# Patient Record
Sex: Female | Born: 1985 | Race: White | Hispanic: No | Marital: Single | Smoking: Current every day smoker
Health system: Southern US, Community
[De-identification: ages and names within clinical notes are randomized; demographics above are authoritative.]

## PROBLEM LIST (undated history)

## (undated) DIAGNOSIS — F319 Bipolar disorder, unspecified: Secondary | ICD-10-CM

## (undated) DIAGNOSIS — F909 Attention-deficit hyperactivity disorder, unspecified type: Secondary | ICD-10-CM

## (undated) HISTORY — PX: CHOLECYSTECTOMY: SHX55

## (undated) HISTORY — PX: ABDOMINAL HYSTERECTOMY: SHX81

---

## 2019-03-10 ENCOUNTER — Other Ambulatory Visit: Payer: Self-pay

## 2019-03-10 ENCOUNTER — Encounter (HOSPITAL_COMMUNITY): Payer: Self-pay

## 2019-03-10 ENCOUNTER — Emergency Department (HOSPITAL_COMMUNITY)
Admission: EM | Admit: 2019-03-10 | Discharge: 2019-03-11 | Disposition: A | Discharge status: Home / Self Care | Payer: Self-pay | Attending: Emergency Medicine | Admitting: Emergency Medicine

## 2019-03-10 DIAGNOSIS — R109 Unspecified abdominal pain: Secondary | ICD-10-CM | POA: Insufficient documentation

## 2019-03-10 DIAGNOSIS — Z5321 Procedure and treatment not carried out due to patient leaving prior to being seen by health care provider: Secondary | ICD-10-CM | POA: Insufficient documentation

## 2019-03-10 LAB — URINALYSIS, ROUTINE W REFLEX MICROSCOPIC
Bacteria, UA: NONE SEEN
Bilirubin Urine: NEGATIVE
Glucose, UA: NEGATIVE mg/dL
Hgb urine dipstick: NEGATIVE
Ketones, ur: NEGATIVE mg/dL
Nitrite: NEGATIVE
Protein, ur: NEGATIVE mg/dL
Specific Gravity, Urine: 1.012 (ref 1.005–1.030)
pH: 5 (ref 5.0–8.0)

## 2019-03-10 LAB — CBC
HCT: 37 % (ref 36.0–46.0)
Hemoglobin: 12.2 g/dL (ref 12.0–15.0)
MCH: 30.5 pg (ref 26.0–34.0)
MCHC: 33 g/dL (ref 30.0–36.0)
MCV: 92.5 fL (ref 80.0–100.0)
Platelets: 282 10*3/uL (ref 150–400)
RBC: 4 MIL/uL (ref 3.87–5.11)
RDW: 11.8 % (ref 11.5–15.5)
WBC: 7.9 10*3/uL (ref 4.0–10.5)
nRBC: 0 % (ref 0.0–0.2)

## 2019-03-10 LAB — COMPREHENSIVE METABOLIC PANEL
ALT: 73 U/L — ABNORMAL HIGH (ref 0–44)
AST: 48 U/L — ABNORMAL HIGH (ref 15–41)
Albumin: 3.4 g/dL — ABNORMAL LOW (ref 3.5–5.0)
Alkaline Phosphatase: 68 U/L (ref 38–126)
Anion gap: 8 (ref 5–15)
BUN: 8 mg/dL (ref 6–20)
CO2: 27 mmol/L (ref 22–32)
Calcium: 9 mg/dL (ref 8.9–10.3)
Chloride: 106 mmol/L (ref 98–111)
Creatinine, Ser: 0.76 mg/dL (ref 0.44–1.00)
GFR calc Af Amer: >60 mL/min (ref >60)
GFR calc non Af Amer: >60 mL/min (ref >60)
Glucose, Bld: 81 mg/dL (ref 70–99)
Potassium: 3.7 mmol/L (ref 3.5–5.1)
Sodium: 141 mmol/L (ref 135–145)
Total Bilirubin: 0.4 mg/dL (ref 0.3–1.2)
Total Protein: 6.1 g/dL — ABNORMAL LOW (ref 6.5–8.1)

## 2019-03-10 LAB — I-STAT BETA HCG BLOOD, ED (MC, WL, AP ONLY): I-stat hCG, quantitative: <5 m[IU]/mL (ref <5)

## 2019-03-10 LAB — LIPASE, BLOOD: Lipase: 34 U/L (ref 11–51)

## 2019-03-10 NOTE — ED Triage Notes (Signed)
Pt arrives POV for eval of generalized abd pain x 1 week, w/ vomiting. Pt states she is unable to keep anything down. Pt is noted to be eating [employer] during triage. Denies dysuria, vag bleeding/DC. Denies known sick contacts

## 2019-03-19 ENCOUNTER — Encounter (HOSPITAL_COMMUNITY): Payer: Self-pay | Admitting: Student

## 2019-03-19 ENCOUNTER — Other Ambulatory Visit: Payer: Self-pay

## 2019-03-19 ENCOUNTER — Emergency Department (HOSPITAL_COMMUNITY): Payer: Self-pay

## 2019-03-19 ENCOUNTER — Emergency Department (HOSPITAL_COMMUNITY)
Admission: EM | Admit: 2019-03-19 | Discharge: 2019-03-19 | Disposition: A | Discharge status: Home / Self Care | Payer: Self-pay | Attending: Emergency Medicine | Admitting: Emergency Medicine

## 2019-03-19 DIAGNOSIS — R1030 Lower abdominal pain, unspecified: Secondary | ICD-10-CM | POA: Insufficient documentation

## 2019-03-19 DIAGNOSIS — N3000 Acute cystitis without hematuria: Secondary | ICD-10-CM | POA: Insufficient documentation

## 2019-03-19 DIAGNOSIS — R1110 Vomiting, unspecified: Secondary | ICD-10-CM | POA: Insufficient documentation

## 2019-03-19 LAB — CBC WITH DIFFERENTIAL/PLATELET
Abs Immature Granulocytes: 0.04 10*3/uL (ref 0.00–0.07)
Basophils Absolute: 0 10*3/uL (ref 0.0–0.1)
Basophils Relative: 0 %
Eosinophils Absolute: 0 10*3/uL (ref 0.0–0.5)
Eosinophils Relative: 0 %
HCT: 40.6 % (ref 36.0–46.0)
Hemoglobin: 13.3 g/dL (ref 12.0–15.0)
Immature Granulocytes: 0 %
Lymphocytes Relative: 12 %
Lymphs Abs: 1.7 10*3/uL (ref 0.7–4.0)
MCH: 29.7 pg (ref 26.0–34.0)
MCHC: 32.8 g/dL (ref 30.0–36.0)
MCV: 90.6 fL (ref 80.0–100.0)
Monocytes Absolute: 1 10*3/uL (ref 0.1–1.0)
Monocytes Relative: 7 %
Neutro Abs: 11.5 10*3/uL — ABNORMAL HIGH (ref 1.7–7.7)
Neutrophils Relative %: 81 %
Platelets: 261 10*3/uL (ref 150–400)
RBC: 4.48 MIL/uL (ref 3.87–5.11)
RDW: 11.9 % (ref 11.5–15.5)
WBC: 14.3 10*3/uL — ABNORMAL HIGH (ref 4.0–10.5)
nRBC: 0 % (ref 0.0–0.2)

## 2019-03-19 LAB — URINALYSIS, ROUTINE W REFLEX MICROSCOPIC
Glucose, UA: NEGATIVE mg/dL
Ketones, ur: NEGATIVE mg/dL
Leukocytes,Ua: NEGATIVE
Nitrite: POSITIVE — AB
Protein, ur: 30 mg/dL — AB
Specific Gravity, Urine: 1.031 — ABNORMAL HIGH (ref 1.005–1.030)
pH: 5 (ref 5.0–8.0)

## 2019-03-19 LAB — I-STAT BETA HCG BLOOD, ED (MC, WL, AP ONLY): I-stat hCG, quantitative: <5 m[IU]/mL (ref <5)

## 2019-03-19 LAB — COMPREHENSIVE METABOLIC PANEL
ALT: 53 U/L — ABNORMAL HIGH (ref 0–44)
AST: 38 U/L (ref 15–41)
Albumin: 3.9 g/dL (ref 3.5–5.0)
Alkaline Phosphatase: 75 U/L (ref 38–126)
Anion gap: 10 (ref 5–15)
BUN: 11 mg/dL (ref 6–20)
CO2: 24 mmol/L (ref 22–32)
Calcium: 9.2 mg/dL (ref 8.9–10.3)
Chloride: 102 mmol/L (ref 98–111)
Creatinine, Ser: 0.85 mg/dL (ref 0.44–1.00)
GFR calc Af Amer: >60 mL/min (ref >60)
GFR calc non Af Amer: >60 mL/min (ref >60)
Glucose, Bld: 89 mg/dL (ref 70–99)
Potassium: 3.8 mmol/L (ref 3.5–5.1)
Sodium: 136 mmol/L (ref 135–145)
Total Bilirubin: 1 mg/dL (ref 0.3–1.2)
Total Protein: 6.9 g/dL (ref 6.5–8.1)

## 2019-03-19 MED ORDER — IOHEXOL 300 MG/ML  SOLN
100.0000 mL | Freq: Once | INTRAMUSCULAR | Status: AC | PRN
  Administered 2019-03-19: 100 mL via INTRAVENOUS

## 2019-03-19 MED ORDER — KETOROLAC TROMETHAMINE 15 MG/ML IJ SOLN
15.0000 mg | Freq: Once | INTRAMUSCULAR | Status: AC
  Administered 2019-03-19: 15 mg via INTRAVENOUS
  Filled 2019-03-19: qty 1

## 2019-03-19 MED ORDER — DICYCLOMINE HCL 10 MG PO CAPS
10.0000 mg | ORAL_CAPSULE | Freq: Once | ORAL | Status: AC
  Administered 2019-03-19: 16:00:00 10 mg via ORAL
  Filled 2019-03-19: qty 1

## 2019-03-19 MED ORDER — DICYCLOMINE HCL 20 MG PO TABS: 20.0000 mg | ORAL_TABLET | Freq: Two times a day (BID) | ORAL | 0 refills | Status: DC

## 2019-03-19 MED ORDER — CEPHALEXIN 250 MG PO CAPS: 250.0000 mg | ORAL_CAPSULE | Freq: Four times a day (QID) | ORAL | 0 refills | Status: AC

## 2019-03-19 NOTE — Discharge Instructions (Addendum)
Take antibiotics as prescribed.  Take entire course, even if your symptoms improve. Make sure you are staying well-hydrated water.  Your urine should be clear to pale yellow. Use Tylenol and ibuprofen as needed for pain. Use heating pads to help with pain. You may try Bentyl to help with spasm. Return to the emergency room if you develop high fevers, persistent vomiting, severe worsening pain, or any new, worsening, concerning symptoms.

## 2019-03-19 NOTE — ED Triage Notes (Addendum)
Pt POV from working outside d/t lower abd pain that radiates to lower back w/ dizziness around 1000 this AM. Pt states the abd pain has been occurring for x2 wks., Pt recently seen for same thing 03/10/2019.   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

## 2019-03-19 NOTE — ED Provider Notes (Signed)
[facility] Provider Note   CSN: [id number] Arrival date & time: 03/19/19  1140    History   Chief Complaint Chief Complaint  Patient presents with   Abdominal Pain   Dizziness    HPI Gwendolyn Hampton is a 33 y.o. female presenting for evaluation of lower abdominal pain.  Patient states the past 2 weeks, she has been having intermittent lower abdominal pain worse on the left side.  Her pain was severe this morning, wrapping around to her back.  She reports one episode of vomiting.  She felt really hot and then cold when the pain occurred.  Patient states she has had several episodes of emesis, usually once every couple days.  Nothing makes her pain better, took Tylenol without improvement.  Nothing makes her pain worse including p.o., bowel movements, urination.  Patient reports a history of ovarian cyst, states this feels similar but not exactly the same.  She had a partial hysterectomy several years ago for contraception.  She is sexually active, but states she always uses condoms.  No vaginal discharge.  She denies fevers, chills, chest pain, shortness of breath, current nausea, dysuria, hematuria, or abnormal bowel movements.  Patient states today her urine was very dark.  She states she has no medical problems, takes no medications daily.  She smokes cigarettes, denies alcohol use, reports meth use.  Last used 5 days ago.  Denies other drug use.     HPI  No past medical history on file.  There are no active problems to display for this patient.    OB History   No obstetric history on file.      Home Medications    Prior to Admission medications   Medication Sig Start Date End Date Taking? Authorizing Provider  Acetaminophen 500 MG coapsule Take 1,000 mg by mouth every 6 (six) hours as needed.   Yes [provider]  cephALEXin (KEFLEX) 250 MG capsule Take 1 capsule (250 mg total) by mouth 4 (four) times daily for 7 days.  03/19/19 03/26/19  Sakina Briones, PA-C  dicyclomine (BENTYL) 20 MG tablet Take 1 tablet (20 mg total) by mouth 2 (two) times daily. 03/19/19   Starnisha Batrez, PA-C    Family History No family history on file.  Social History Social History   Tobacco Use   Smoking status: Not on file  Substance Use Topics   Alcohol use: Not on file   Drug use: Not on file     Allergies   Bee venom, Ibuprofen, Klonopin [clonazepam], Morphine and related, Sulfa antibiotics, and Tramadol   Review of Systems Review of Systems  Gastrointestinal: Positive for abdominal pain and vomiting (X1).  All other systems reviewed and are negative.    Physical Exam Updated Vital Signs BP 106/89 (BP Location: Left Arm)    Pulse 78    Temp (!) 97.5 F (36.4 C) (Oral)    Resp 16    Ht 5\' 1"  (1.549 m)    Wt 72.6 kg    SpO2 100%    BMI 30.23 kg/m   Physical Exam Vitals signs and nursing note reviewed.  Constitutional:      General: She is not in acute distress.    Appearance: She is well-developed.     Comments: Appears nontoxic  HENT:     Head: Normocephalic and atraumatic.  Eyes:     Conjunctiva/sclera: Conjunctivae normal.     Pupils: Pupils are equal, round, and reactive to light.  Neck:     Musculoskeletal: Normal range of motion and neck supple.  Cardiovascular:     Rate and Rhythm: Normal rate and regular rhythm.     Pulses: Normal pulses.  Pulmonary:     Effort: Pulmonary effort is normal. No respiratory distress.     Breath sounds: Normal breath sounds. No wheezing.  Abdominal:     General: There is no distension.     Palpations: Abdomen is soft. There is no mass.     Tenderness: There is abdominal tenderness. There is no guarding or rebound.     Comments: ttp of generalized abd, worse surpapubic.   Genitourinary:    Comments: Pt declined Musculoskeletal: Normal range of motion.  Skin:    General: Skin is warm and dry.     Capillary Refill: Capillary refill takes less than 2  seconds.  Neurological:     Mental Status: She is alert and oriented to person, place, and time.      ED Treatments / Results  Labs (all labs ordered are listed, but only abnormal results are displayed) Labs Reviewed  CBC WITH DIFFERENTIAL/PLATELET - Abnormal; Notable for the following components:      Result Value   WBC 14.3 (*)    Neutro Abs 11.5 (*)    All other components within normal limits  COMPREHENSIVE METABOLIC PANEL - Abnormal; Notable for the following components:   ALT 53 (*)    All other components within normal limits  URINALYSIS, ROUTINE W REFLEX MICROSCOPIC - Abnormal; Notable for the following components:   Color, Urine AMBER (*)    APPearance HAZY (*)    Specific Gravity, Urine 1.031 (*)    Hgb urine dipstick SMALL (*)    Bilirubin Urine SMALL (*)    Protein, ur 30 (*)    Nitrite POSITIVE (*)    Bacteria, UA MANY (*)    All other components within normal limits  URINE CULTURE  I-STAT BETA HCG BLOOD, ED ([facility], [facility], [facility] ONLY)    EKG None  Radiology Ct Abdomen Pelvis W Contrast  Result Date: 03/19/2019 CLINICAL DATA:  Abdominal pain EXAM: CT ABDOMEN AND PELVIS WITH CONTRAST TECHNIQUE: Multidetector CT imaging of the abdomen and pelvis was performed using the standard protocol following bolus administration of intravenous contrast. CONTRAST:  100mL OMNIPAQUE IOHEXOL 300 MG/ML  SOLN COMPARISON:  None. FINDINGS: Lower chest: Lung bases are clear. Hepatobiliary: There is an apparent cyst in the dome of the liver on the right measuring 1 x 1 cm. No other focal liver lesion evident. Gallbladder is absent. There is no appreciable biliary duct dilatation. Pancreas: There is no evident pancreatic mass or inflammatory focus. Spleen: No splenic lesions are appreciable. Adrenals/Urinary Tract: Adrenals bilaterally appear normal. Kidneys bilaterally show no evident mass or hydronephrosis on either side. There is no appreciable renal or ureteral calculus on either side. The  urinary bladder is midline with wall thickness within normal limits. Stomach/Bowel: There is moderate stool in the distal colon. There is no appreciable bowel wall or mesenteric thickening. Terminal ileum appears unremarkable. There is no evident bowel obstruction. There is no free air or portal venous air. Vascular/Lymphatic: There is no abdominal aortic aneurysm. No vascular lesions are evident. There is no adenopathy in the abdomen or pelvis. Reproductive: Uterus absent.  No pelvic mass evident. Other: Appendix appears normal. No abscess or ascites is evident in the abdomen or pelvis. There is a small ventral hernia containing only fat. Musculoskeletal: There are no blastic or lytic  bone lesions IMPRESSION: 1. A cause for patient's symptoms has not been established with this study. 2. No bowel obstruction. No appreciable diverticular disease. No abscess in the abdomen or pelvis. Appendix appears normal. 3. No evident renal or ureteral calculus. No hydronephrosis. Urinary bladder wall thickness is within normal limits for degree of distention. 4.  Small ventral hernia containing only fat. Electronically Signed   By: Lowella Grip III M.D.   On: 03/19/2019 15:00    Procedures Procedures (including critical care time)  Medications Ordered in ED Medications  dicyclomine (BENTYL) capsule 10 mg (has no administration in time range)  ketorolac (TORADOL) 15 MG/ML injection 15 mg (15 mg Intravenous Given 03/19/19 1213)  iohexol (OMNIPAQUE) 300 MG/ML solution 100 mL (100 mLs Intravenous Contrast Given 03/19/19 1438)     Initial Impression / Assessment and Plan / ED Course  I have reviewed the triage vital signs and the nursing notes.  Pertinent labs & imaging results that were available during my care of the patient were reviewed by me and considered in my medical decision making (see chart for details).        Patient resenting for evaluation of lower abdominal pain.  Physical exam reassuring, she  appears nontoxic.  She is got generalized abdominal tenderness worse in the suprapubic area.  Consider colitis versus diverticulitis versus UTI versus pyelo versus kidney stone versus cyst.  Lower suspicion for torsion, TOA, PID as patient denies vaginal discharge.  Will obtain labs, urine, and CT abdomen pelvis for further evaluation.  Labs show leukocytosis of 14.3.  Otherwise reassuring.  Urine change from last week, now has small blood and hemoglobin with positive nitrites and many bacteria.  Will send for culture, but in the setting of suprapubic pain and leukocytosis will treat for UTI.  CT abdomen pelvis pending.  CT abdomen pelvis negative.  Discussed findings with patient.  Offered pelvic exam for testing for STIs and other vaginal infections, patient declined.  Discussed treatment with antibiotics, Tylenol, ibuprofen.  Bentyl as needed for spasm.  At this time, patient appears safe for discharge.  Return precautions given.  Patient states she understands and agrees to plan.  Final Clinical Impressions(s) / ED Diagnoses   Final diagnoses:  Lower abdominal pain  Acute cystitis without hematuria    ED Discharge Orders         Ordered    cephALEXin (KEFLEX) 250 MG capsule  4 times daily     03/19/19 1522    dicyclomine (BENTYL) 20 MG tablet  2 times daily     03/19/19 1522           Franchot Heidelberg, PA-C 03/19/19 1537    Long, Wonda Olds, MD 03/20/19 [phone]

## 2019-03-19 NOTE — ED Notes (Signed)
Patient Alert and oriented to baseline. Stable and ambulatory to baseline. Patient verbalized understanding of the discharge instructions.  Patient belongings were taken by the patient.   

## 2019-03-19 NOTE — ED Notes (Signed)
Provider at bedside

## 2019-03-21 LAB — URINE CULTURE: Culture: 80000 — AB

## 2019-03-22 ENCOUNTER — Telehealth: Payer: Self-pay | Admitting: Emergency Medicine

## 2019-03-22 NOTE — Telephone Encounter (Signed)
Post ED Visit - Positive Culture Follow-up  Culture report reviewed by antimicrobial stewardship pharmacist: [place] Team []  Elenor Quinones, Pharm.D. []  Heide Guile, Pharm.D., BCPS AQ-ID []  Parks Neptune, Pharm.D., BCPS []  Alycia Rossetti, Pharm.D., BCPS []  [place], [place].D., BCPS, AAHIVP []  Legrand Como, Pharm.D., BCPS, AAHIVP []  Salome Arnt, PharmD, BCPS [x]  Oletha Cruel, PharmD, BCPS []  Hughes Better, PharmD, BCPS []  Leeroy Cha, PharmD []  Laqueta Linden, PharmD, BCPS []  Albertina Parr, PharmD  [place] Team []  Leodis Sias, PharmD []  Lindell Spar, PharmD []  Royetta Asal, PharmD []  Graylin Shiver, Rph []  Rema Fendt) Glennon Mac, PharmD []  Arlyn Dunning, PharmD []  Netta Cedars, PharmD []  Dia Sitter, PharmD []  Leone Haven, PharmD []  Gretta Arab, PharmD []  Theodis Shove, PharmD []  Peggyann Juba, PharmD []  Reuel Boom, PharmD   Positive urine culture Treated with Cephalexin, organism sensitive to the same and no further patient follow-up is required at this time.  Gwendolyn Hampton Gwendolyn Hampton 03/22/2019, 3:44 PM

## 2019-05-26 ENCOUNTER — Emergency Department (HOSPITAL_COMMUNITY): Payer: Self-pay

## 2019-05-26 ENCOUNTER — Other Ambulatory Visit: Payer: Self-pay

## 2019-05-26 ENCOUNTER — Encounter (HOSPITAL_COMMUNITY): Payer: Self-pay | Admitting: Emergency Medicine

## 2019-05-26 ENCOUNTER — Emergency Department (HOSPITAL_COMMUNITY)
Admission: EM | Admit: 2019-05-26 | Discharge: 2019-05-26 | Disposition: A | Discharge status: Home / Self Care | Payer: Self-pay | Attending: Emergency Medicine | Admitting: Emergency Medicine

## 2019-05-26 DIAGNOSIS — S92514A Nondisplaced fracture of proximal phalanx of right lesser toe(s), initial encounter for closed fracture: Secondary | ICD-10-CM | POA: Insufficient documentation

## 2019-05-26 DIAGNOSIS — Z79899 Other long term (current) drug therapy: Secondary | ICD-10-CM | POA: Insufficient documentation

## 2019-05-26 DIAGNOSIS — Y929 Unspecified place or not applicable: Secondary | ICD-10-CM | POA: Insufficient documentation

## 2019-05-26 DIAGNOSIS — Y999 Unspecified external cause status: Secondary | ICD-10-CM | POA: Insufficient documentation

## 2019-05-26 DIAGNOSIS — W228XXA Striking against or struck by other objects, initial encounter: Secondary | ICD-10-CM | POA: Insufficient documentation

## 2019-05-26 DIAGNOSIS — Y939 Activity, unspecified: Secondary | ICD-10-CM | POA: Insufficient documentation

## 2019-05-26 DIAGNOSIS — F17210 Nicotine dependence, cigarettes, uncomplicated: Secondary | ICD-10-CM | POA: Insufficient documentation

## 2019-05-26 MED ORDER — HYDROCODONE-ACETAMINOPHEN 5-325 MG PO TABS: 1.0000 | ORAL_TABLET | Freq: Four times a day (QID) | ORAL | 0 refills | Status: DC | PRN

## 2019-05-26 NOTE — ED Provider Notes (Signed)
[facility]   CSN: [id number] Arrival date & time: 05/26/19  2027     History   Chief Complaint Chief Complaint  Patient presents with  . Toe Pain    HPI Gwendolyn Hampton is a 33 y.o. female.     HPI Patient presents with right toe pain.  States she hit her toe on something.  No other injury.  States Tylenol is not working.  Denies pregnancy.  Skin intact. History reviewed. No pertinent past medical history.  There are no active problems to display for this patient.   Past Surgical History:  Procedure Laterality Date  . ABDOMINAL HYSTERECTOMY    . CHOLECYSTECTOMY       OB History   No obstetric history on file.      Home Medications    Prior to Admission medications   Medication Sig Start Date End Date Taking? Authorizing Provider  Acetaminophen 500 MG coapsule Take 1,000 mg by mouth every 6 (six) hours as needed.    [provider]  dicyclomine (BENTYL) 20 MG tablet Take 1 tablet (20 mg total) by mouth 2 (two) times daily. 03/19/19   Caccavale, Sophia, PA-C  HYDROcodone-acetaminophen (NORCO/VICODIN) 5-325 MG tablet Take 1-2 tablets by mouth every 6 (six) hours as needed. 05/26/19   Benjiman CorePickering, Corbin Falck, MD    Family History No family history on file.  Social History Social History   Tobacco Use  . Smoking status: Current Every Day Smoker  . Smokeless tobacco: Never Used  Substance Use Topics  . Alcohol use: Yes  . Drug use: Yes     Allergies   Bee venom, Ibuprofen, Klonopin [clonazepam], Morphine and related, Sulfa antibiotics, and Tramadol   Review of Systems Review of Systems  Constitutional: Negative for chills.  Musculoskeletal:       Right fifth toe pain.  Skin: Negative for wound.  Neurological: Negative for weakness and numbness.     Physical Exam Updated Vital Signs BP 106/68 (BP Location: Right Arm)   Pulse (!) 58   SpO2 98%   Physical Exam Vitals signs and nursing  note reviewed.  Musculoskeletal:     Comments: Tenderness over right fifth toe.  Some swelling of toe and foot.  Skin intact.  Skin:    General: Skin is warm.     Capillary Refill: Capillary refill takes less than 2 seconds.     Findings: No erythema.  Neurological:     Mental Status: She is alert and oriented to person, place, and time.      ED Treatments / Results  Labs (all labs ordered are listed, but only abnormal results are displayed) Labs Reviewed - No data to display  EKG None  Radiology Dg Foot Complete Right  Result Date: 05/26/2019 CLINICAL DATA:  Pain about the right little toe since the patient struck a bed 4 days ago. Initial encounter. EXAM: RIGHT FOOT COMPLETE - 3+ VIEW COMPARISON:  None. FINDINGS: The patient has an acute fracture of the diaphysis of the proximal phalanx of the little toe. Minimal lateral displacement of the distal fragment is identified. The fracture does not involve an articular surface. No other abnormality is seen. IMPRESSION: Acute diaphyseal fracture proximal phalanx right little toe. Electronically Signed   By: Drusilla Kannerhomas  Dalessio M.D.   On: 05/26/2019 21:17    Procedures Procedures (including critical care time)  Medications Ordered in ED Medications - No data to display   Initial Impression / Assessment and Plan /  ED Course  I have reviewed the triage vital signs and the nursing notes.  Pertinent labs & imaging results that were available during my care of the patient were reviewed by me and considered in my medical decision making (see chart for details).        Patient with fracture of fifth toe.  Will buddy tape and postop shoe.  Have follow-up in 1 week.  No other apparent injury over foot.  Final Clinical Impressions(s) / ED Diagnoses   Final diagnoses:  Closed nondisplaced fracture of proximal phalanx of lesser toe of right foot, initial encounter    ED Discharge Orders         Ordered    HYDROcodone-acetaminophen  (NORCO/VICODIN) 5-325 MG tablet  Every 6 hours PRN     05/26/19 2207           Davonna Belling, MD 05/26/19 2213

## 2019-05-26 NOTE — ED Triage Notes (Signed)
Pt c/o right toe/foot pain after hitting her foot today. Swelling noted, ambulatory without difficulty.

## 2019-06-10 ENCOUNTER — Emergency Department (HOSPITAL_COMMUNITY)
Admission: EM | Admit: 2019-06-10 | Discharge: 2019-06-10 | Disposition: A | Discharge status: Home / Self Care | Payer: Self-pay | Attending: Emergency Medicine | Admitting: Emergency Medicine

## 2019-06-10 ENCOUNTER — Other Ambulatory Visit: Payer: Self-pay

## 2019-06-10 ENCOUNTER — Encounter (HOSPITAL_COMMUNITY): Payer: Self-pay | Admitting: Emergency Medicine

## 2019-06-10 DIAGNOSIS — S99921A Unspecified injury of right foot, initial encounter: Secondary | ICD-10-CM

## 2019-06-10 DIAGNOSIS — X58XXXD Exposure to other specified factors, subsequent encounter: Secondary | ICD-10-CM | POA: Insufficient documentation

## 2019-06-10 DIAGNOSIS — Z79899 Other long term (current) drug therapy: Secondary | ICD-10-CM | POA: Insufficient documentation

## 2019-06-10 DIAGNOSIS — S99921D Unspecified injury of right foot, subsequent encounter: Secondary | ICD-10-CM | POA: Insufficient documentation

## 2019-06-10 DIAGNOSIS — F17200 Nicotine dependence, unspecified, uncomplicated: Secondary | ICD-10-CM | POA: Insufficient documentation

## 2019-06-10 NOTE — ED Provider Notes (Signed)
[facility] EMERGENCY DEPARTMENT Provider Note   CSN: [id number] Arrival date & time: 06/10/19  2047     History   Chief Complaint Chief Complaint  Patient presents with  . Toe Injury    HPI Gwendolyn Hampton is a 33 y.o. female.     33 year old female presents with continued pain to her right fifth little toe.  Injured it several weeks ago and does have a walking shoe.  No new injury     History reviewed. No pertinent past medical history.  There are no active problems to display for this patient.   Past Surgical History:  Procedure Laterality Date  . ABDOMINAL HYSTERECTOMY    . CHOLECYSTECTOMY       OB History   No obstetric history on file.      Home Medications    Prior to Admission medications   Medication Sig Start Date End Date Taking? Authorizing Provider  Acetaminophen 500 MG coapsule Take 1,000 mg by mouth every 6 (six) hours as needed.    [provider]  dicyclomine (BENTYL) 20 MG tablet Take 1 tablet (20 mg total) by mouth 2 (two) times daily. 03/19/19   Caccavale, Sophia, PA-C  HYDROcodone-acetaminophen (NORCO/VICODIN) 5-325 MG tablet Take 1-2 tablets by mouth every 6 (six) hours as needed. 05/26/19   Benjiman Core, MD    Family History No family history on file.  Social History Social History   Tobacco Use  . Smoking status: Current Every Day Smoker  . Smokeless tobacco: Never Used  Substance Use Topics  . Alcohol use: Yes  . Drug use: Yes     Allergies   Bee venom, Ibuprofen, Klonopin [clonazepam], Morphine and related, Sulfa antibiotics, and Tramadol   Review of Systems Review of Systems  All other systems reviewed and are negative.    Physical Exam Updated Vital Signs BP 110/60 (BP Location: Right Arm)   Pulse 66   Temp 98.6 F (37 C) (Oral)   Resp 20   SpO2 98%   Physical Exam Vitals signs and nursing note reviewed.  Constitutional:      Appearance: She is well-developed. She is not  toxic-appearing.  HENT:     Head: Normocephalic and atraumatic.  Eyes:     Conjunctiva/sclera: Conjunctivae normal.     Pupils: Pupils are equal, round, and reactive to light.  Neck:     Musculoskeletal: Normal range of motion.  Cardiovascular:     Rate and Rhythm: Normal rate.  Pulmonary:     Effort: Pulmonary effort is normal.  Musculoskeletal:       Feet:  Skin:    General: Skin is warm and dry.  Neurological:     Mental Status: She is alert and oriented to person, place, and time.      ED Treatments / Results  Labs (all labs ordered are listed, but only abnormal results are displayed) Labs Reviewed - No data to display  EKG None  Radiology No results found.  Procedures Procedures (including critical care time)  Medications Ordered in ED Medications - No data to display   Initial Impression / Assessment and Plan / ED Course  I have reviewed the triage vital signs and the nursing notes.  Pertinent labs & imaging results that were available during my care of the patient were reviewed by me and considered in my medical decision making (see chart for details).        Toe buddy taped.  Patient instructed to use Tylenol or  Motrin as needed for pain  Final Clinical Impressions(s) / ED Diagnoses   Final diagnoses:  None    ED Discharge Orders    None       Lacretia Leigh, MD 06/10/19 2154

## 2019-06-10 NOTE — Discharge Instructions (Addendum)
Use Tylenol and Motrin as needed for pain °

## 2019-06-10 NOTE — ED Triage Notes (Signed)
Patient requesting pain medication/prescription refill for her right 5th toe pain injured 2 weeks ago , she accidentally hit it against a wall .

## 2020-11-29 IMAGING — CT CT ABDOMEN AND PELVIS WITH CONTRAST
2 of 5 series · 14 of 46 positions shown, 18 images · IV contrast (Omni 300)
Comparison: None.

CLINICAL DATA: Abdominal pain

EXAM:
CT ABDOMEN AND PELVIS WITH CONTRAST
TECHNIQUE: Multidetector CT imaging of the abdomen and pelvis was performed
using the standard protocol following bolus administration of
intravenous contrast.
CONTRAST:  100mL OMNIPAQUE IOHEXOL 300 MG/ML  SOLN

[Series 3: a/p w/ 5mm · axial · 0.91mm/px · z∈[+681,+1131]mm · 11 of 105 slices shown, 15 images]
[im 10/105  soft-tissue]
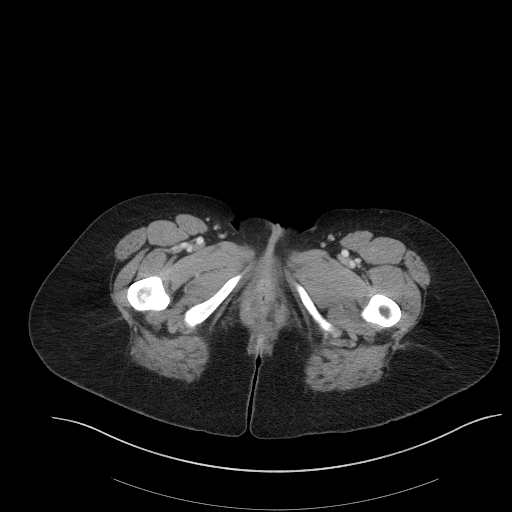
[im 10/105  bone]
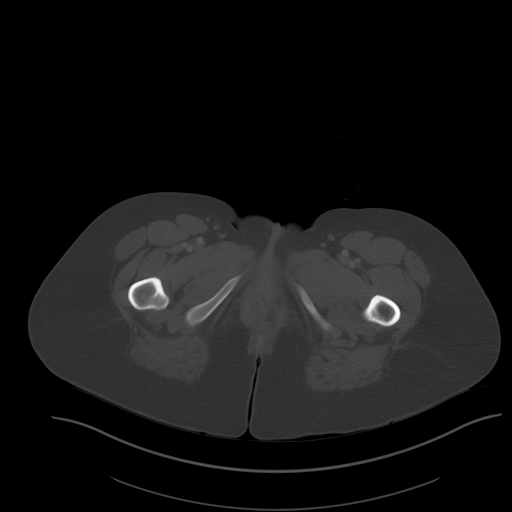
[im 19/105  soft-tissue]
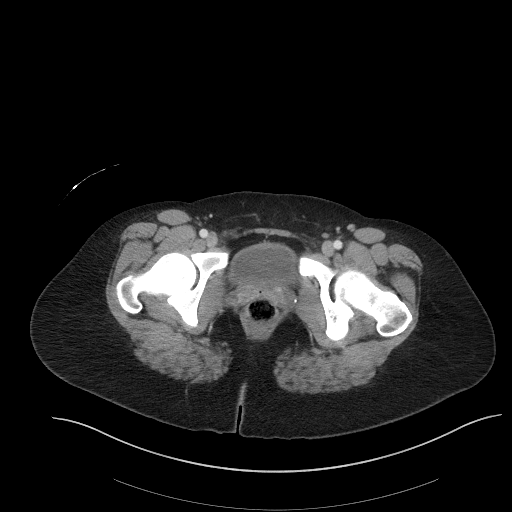
[im 32/105  soft-tissue]
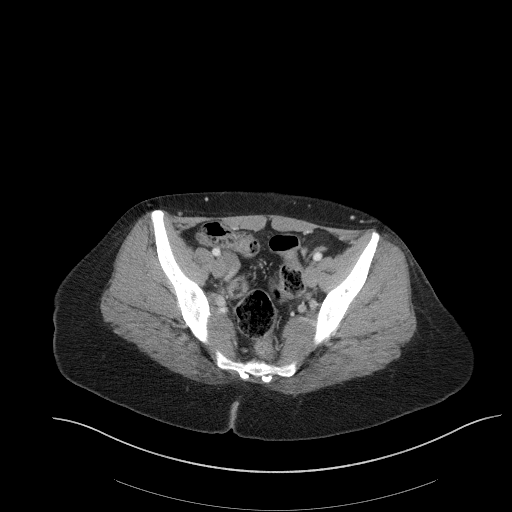
[im 41/105  soft-tissue]
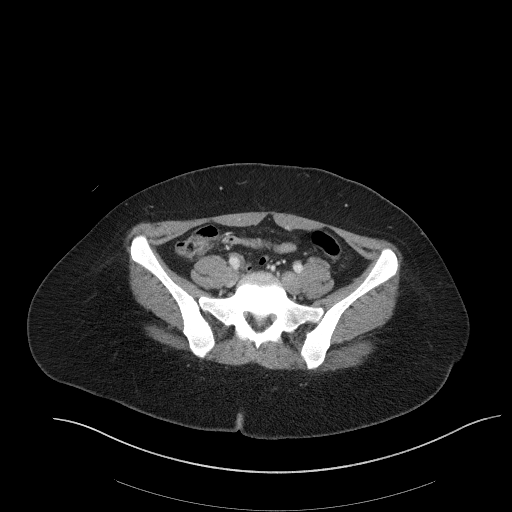
[im 55/105  soft-tissue]
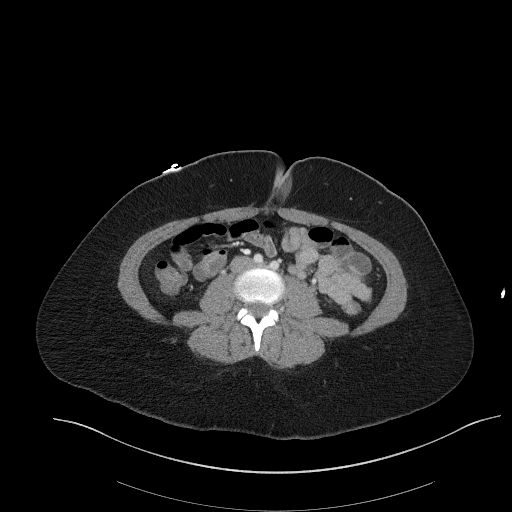
[im 64/105  soft-tissue]
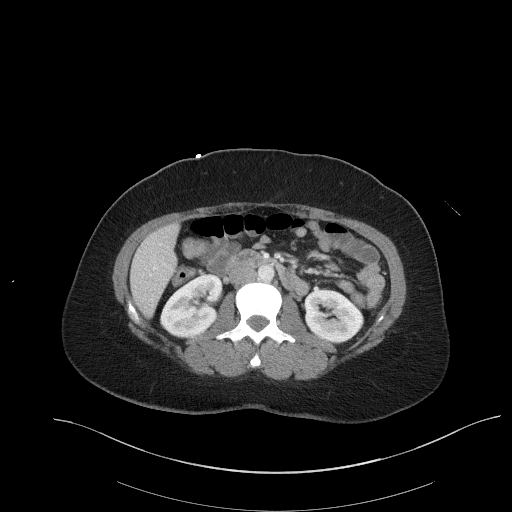
[im 73/105  soft-tissue]
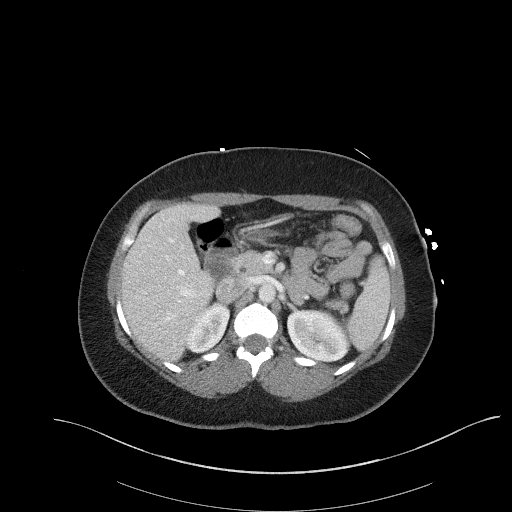
[im 86/105  soft-tissue]
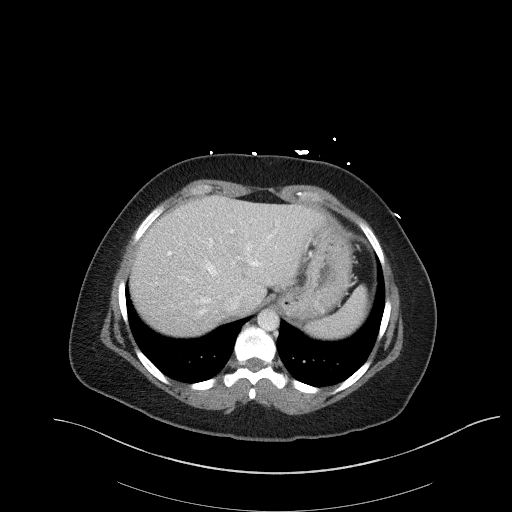
[im 86/105  lung]
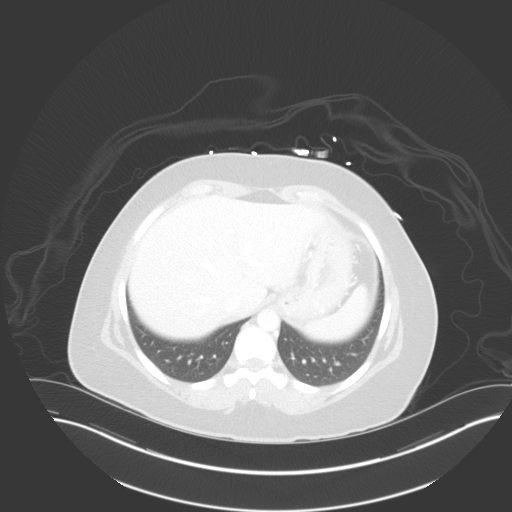
[im 91/105  lung]
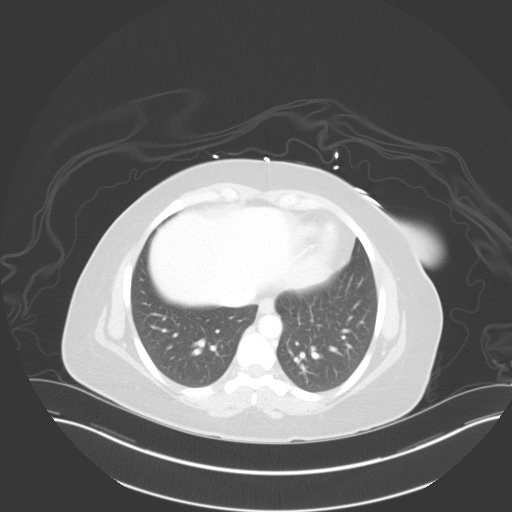
[im 95/105  soft-tissue]
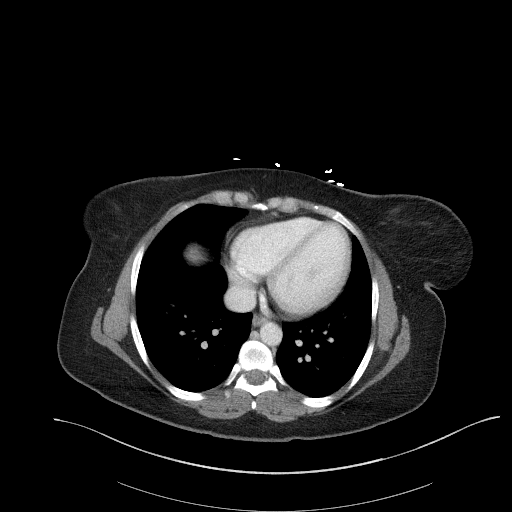
[im 95/105  lung]
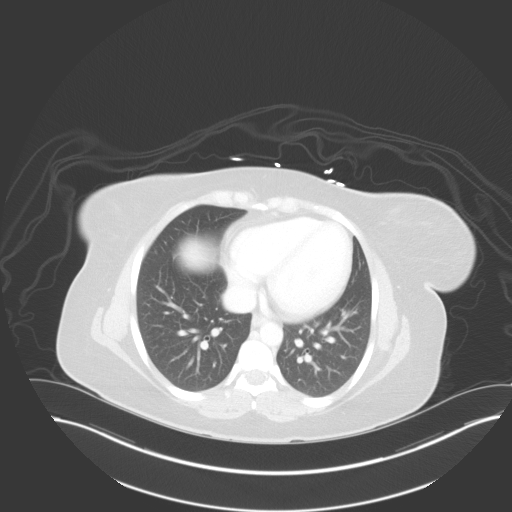
[im 95/105  bone]
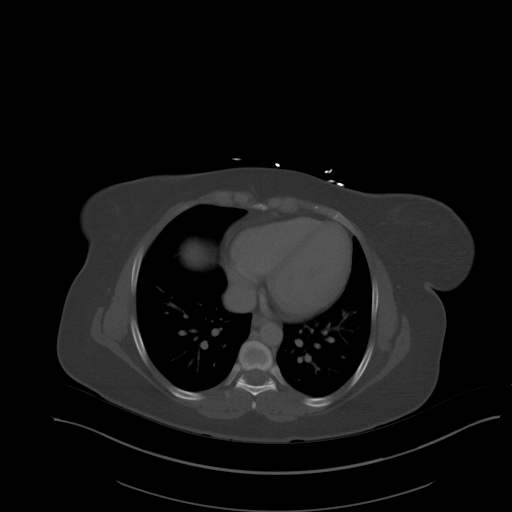
[im 100/105  lung]
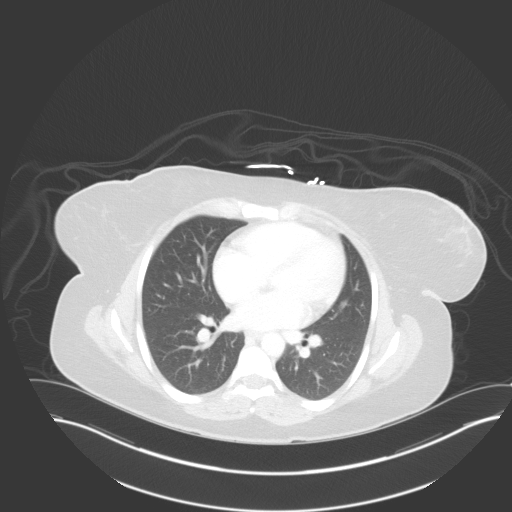

[Series 8: a/p w/ cor · coronal · 0.87mm/px · 3 of 168 slices shown]
[im 56/168  soft-tissue]
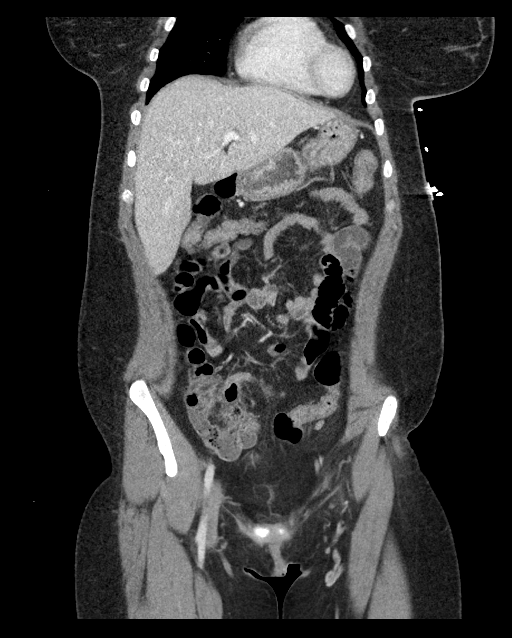
[im 75/168  soft-tissue]
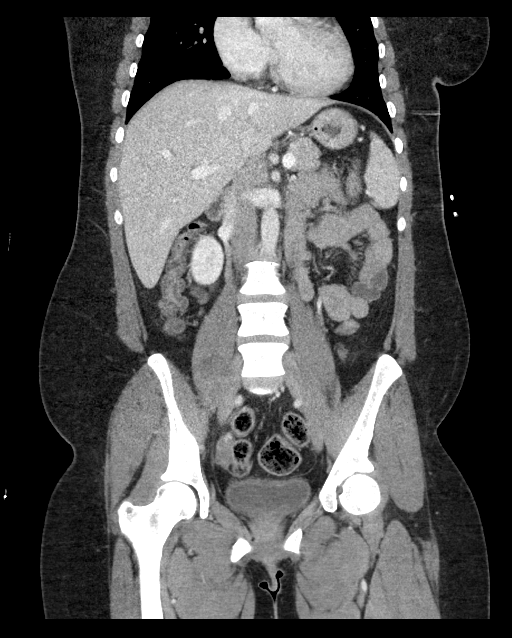
[im 93/168  soft-tissue]
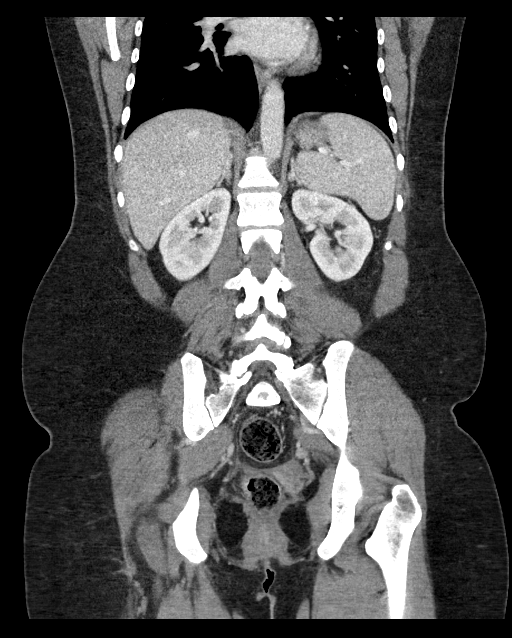

[14 of 46 positions shown; findings below may reference images not displayed]

FINDINGS: Lower chest: Lung bases are clear.

Hepatobiliary: There is an apparent cyst in the dome of the liver on
the right measuring 1 x 1 cm. No other focal liver lesion evident.
Gallbladder is absent. There is no appreciable biliary duct
dilatation.

Pancreas: There is no evident pancreatic mass or inflammatory focus.

Spleen: No splenic lesions are appreciable.

Adrenals/Urinary Tract: Adrenals bilaterally appear normal. Kidneys
bilaterally show no evident mass or hydronephrosis on either side.
There is no appreciable renal or ureteral calculus on either side.
The urinary bladder is midline with wall thickness within normal
limits.

Stomach/Bowel: There is moderate stool in the distal colon. There is
no appreciable bowel wall or mesenteric thickening. Terminal ileum
appears unremarkable. There is no evident bowel obstruction. There
is no free air or portal venous air.

Vascular/Lymphatic: There is no abdominal aortic aneurysm. No
vascular lesions are evident. There is no adenopathy in the abdomen
or pelvis.

Reproductive: Uterus absent.  No pelvic mass evident.

Other: Appendix appears normal. No abscess or ascites is evident in
the abdomen or pelvis. There is a small ventral hernia containing
only fat.

Musculoskeletal: There are no blastic or lytic bone lesions
IMPRESSION: 1. A cause for patient's symptoms has not been established with this
study.

2. No bowel obstruction. No appreciable diverticular disease. No
abscess in the abdomen or pelvis. Appendix appears normal.

3. No evident renal or ureteral calculus. No hydronephrosis. Urinary
bladder wall thickness is within normal limits for degree of
distention.

4.  Small ventral hernia containing only fat.

## 2021-08-12 ENCOUNTER — Emergency Department (HOSPITAL_BASED_OUTPATIENT_CLINIC_OR_DEPARTMENT_OTHER)
Admission: EM | Admit: 2021-08-12 | Discharge: 2021-08-12 | Disposition: A | Discharge status: Home / Self Care | Payer: Self-pay | Attending: Emergency Medicine | Admitting: Emergency Medicine

## 2021-08-12 ENCOUNTER — Encounter (HOSPITAL_BASED_OUTPATIENT_CLINIC_OR_DEPARTMENT_OTHER): Payer: Self-pay | Admitting: *Deleted

## 2021-08-12 ENCOUNTER — Other Ambulatory Visit: Payer: Self-pay

## 2021-08-12 ENCOUNTER — Emergency Department (HOSPITAL_BASED_OUTPATIENT_CLINIC_OR_DEPARTMENT_OTHER): Payer: Self-pay

## 2021-08-12 DIAGNOSIS — M25512 Pain in left shoulder: Secondary | ICD-10-CM | POA: Insufficient documentation

## 2021-08-12 DIAGNOSIS — F17200 Nicotine dependence, unspecified, uncomplicated: Secondary | ICD-10-CM | POA: Insufficient documentation

## 2021-08-12 MED ORDER — METHOCARBAMOL 500 MG PO TABS
500.0000 mg | ORAL_TABLET | Freq: Once | ORAL | Status: AC
  Administered 2021-08-12: 500 mg via ORAL
  Filled 2021-08-12: qty 1

## 2021-08-12 MED ORDER — CYCLOBENZAPRINE HCL 10 MG PO TABS: 10.0000 mg | ORAL_TABLET | Freq: Two times a day (BID) | ORAL | 0 refills | Status: DC | PRN

## 2021-08-12 NOTE — Discharge Instructions (Addendum)
Take Tylenol Motrin as needed for the pain.  Do not take the Flexeril at night, do not take before you drive.  Follow-up with orthopedic if you are still having pain after the weekend.

## 2021-08-12 NOTE — ED Triage Notes (Signed)
States she was helping a friend move 3 days ago and has pain and limited motion of her left shoulder since.

## 2021-08-12 NOTE — ED Provider Notes (Signed)
[facility] EMERGENCY DEPARTMENT Provider Note   CSN: [id number] Arrival date & time: 08/12/21  1853     History Chief Complaint  Patient presents with   Shoulder Pain    Gwendolyn Hampton is a 35 y.o. female.   Shoulder Pain Associated symptoms: no fever    Patient presents with left shoulder pain.  Happened acutely when she was helping a friend move 3 days ago.  Something fell on her shoulder, she is having pain ever since.  The pain itself is intermittent, worse whenever she moves it.  She does have some pain at rest, feels like a sharp shooting pain.  No prior injuries to the shoulder or surgeries.  No numbness or tingling in the digits.  Has not tried any alleviating factors at home.  History reviewed. No pertinent past medical history.  There are no problems to display for this patient.   Past Surgical History:  Procedure Laterality Date   ABDOMINAL HYSTERECTOMY     CHOLECYSTECTOMY       OB History   No obstetric history on file.     No family history on file.  Social History   Tobacco Use   Smoking status: Every Day   Smokeless tobacco: Never  Vaping Use   Vaping Use: Never used  Substance Use Topics   Alcohol use: Not Currently   Drug use: Not Currently    Home Medications Prior to Admission medications   Medication Sig Start Date End Date Taking? Authorizing Provider  Acetaminophen 500 MG coapsule Take 1,000 mg by mouth every 6 (six) hours as needed.    [provider]  dicyclomine (BENTYL) 20 MG tablet Take 1 tablet (20 mg total) by mouth 2 (two) times daily. 03/19/19   Caccavale, Sophia, PA-C  HYDROcodone-acetaminophen (NORCO/VICODIN) 5-325 MG tablet Take 1-2 tablets by mouth every 6 (six) hours as needed. 05/26/19   Benjiman Core, MD    Allergies    Bee venom, Ibuprofen, Klonopin [clonazepam], Morphine and related, Sulfa antibiotics, and Tramadol  Review of Systems   Review of Systems  Constitutional:  Negative for  fever.  Musculoskeletal:  Positive for myalgias. Negative for joint swelling.   Physical Exam Updated Vital Signs BP 121/78 (BP Location: Right Arm)   Pulse 86   Temp 97.8 F (36.6 C) (Oral)   Resp 14   Ht 5\' 1"  (1.549 m)   Wt 71.2 kg   SpO2 100%   BMI 29.66 kg/m   Physical Exam Vitals and nursing note reviewed. Exam conducted with a chaperone present.  Constitutional:      General: She is not in acute distress.    Appearance: Normal appearance.  HENT:     Head: Normocephalic and atraumatic.  Eyes:     General: No scleral icterus.    Extraocular Movements: Extraocular movements intact.     Pupils: Pupils are equal, round, and reactive to light.  Cardiovascular:     Pulses: Normal pulses.     Comments: Radial pulse 2+ Musculoskeletal:        General: Tenderness present. No swelling or deformity. Normal range of motion.  Skin:    Capillary Refill: Capillary refill takes less than 2 seconds.     Coloration: Skin is not jaundiced.  Neurological:     Mental Status: She is alert. Mental status is at baseline.     Coordination: Coordination normal.    ED Results / Procedures / Treatments   Labs (all labs ordered are listed, but only  abnormal results are displayed) Labs Reviewed - No data to display  EKG None  Radiology DG Shoulder Left  Result Date: 08/12/2021 CLINICAL DATA:  Pain after lifting boxes. EXAM: LEFT SHOULDER - 2+ VIEW COMPARISON:  None. FINDINGS: There is no evidence of fracture or dislocation. There is no evidence of arthropathy or other focal bone abnormality. Soft tissues are unremarkable. IMPRESSION: Negative. Electronically Signed   By: Charlett Nose M.D.   On: 08/12/2021 19:35    Procedures Procedures   Medications Ordered in ED Medications - No data to display  ED Course  I have reviewed the triage vital signs and the nursing notes.  Pertinent labs & imaging results that were available during my care of the patient were reviewed by me and  considered in my medical decision making (see chart for details).    MDM Rules/Calculators/A&P                           Stable vitals, nontoxic-appearing.  She is neurovascular intact with strong pulses and good cap refill.  Decreased range of motion secondary to pain, able to tolerate passive range of motion.  point tenderness but diffuse tenderness to the left shoulder.  Radiograph ordered in triage is negative for fracture dislocation.  Not suspect any underlying issue, will discharge with pain medicine and have her follow-up with orthopedic if pain continues.  Patient discharged in stable condition.  Final Clinical Impression(s) / ED Diagnoses Final diagnoses:  None    Rx / DC Orders ED Discharge Orders     None        Theron Arista, Cordelia Poche 08/12/21 2132    Milagros Loll, MD 08/13/21 1930

## 2021-08-19 ENCOUNTER — Encounter (HOSPITAL_BASED_OUTPATIENT_CLINIC_OR_DEPARTMENT_OTHER): Payer: Self-pay | Admitting: *Deleted

## 2021-08-19 ENCOUNTER — Emergency Department (HOSPITAL_BASED_OUTPATIENT_CLINIC_OR_DEPARTMENT_OTHER)
Admission: EM | Admit: 2021-08-19 | Discharge: 2021-08-19 | Disposition: A | Discharge status: Home / Self Care | Payer: Self-pay | Attending: Emergency Medicine | Admitting: Emergency Medicine

## 2021-08-19 DIAGNOSIS — M25512 Pain in left shoulder: Secondary | ICD-10-CM

## 2021-08-19 DIAGNOSIS — F17200 Nicotine dependence, unspecified, uncomplicated: Secondary | ICD-10-CM | POA: Insufficient documentation

## 2021-08-19 NOTE — ED Triage Notes (Signed)
C/o bil upper arm pain , pt needs note to return to work

## 2021-08-19 NOTE — ED Provider Notes (Signed)
[facility] EMERGENCY DEPARTMENT Provider Note   CSN: [id number] Arrival date & time: 08/19/21  2128     History Chief Complaint  Patient presents with   Letter for School/Work    Gwendolyn Hampton is a 35 y.o. female.  Patient was seen here last week after having injury to her left shoulder.  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  She was told to get medical clearance before going back to work.  She has been pain-free for the most part the last several days.  She had normal x-ray at the time of original injury.  She feels good that she will be able to work at this time.  Denies any numbness or tingling or weakness of the left upper extremity.  The history is provided by the patient.  Illness Severity:  Mild Onset quality:  Gradual Duration:  1 week Progression:  Resolved     History reviewed. No pertinent past medical history.  There are no problems to display for this patient.   Past Surgical History:  Procedure Laterality Date   ABDOMINAL HYSTERECTOMY     CHOLECYSTECTOMY       OB History   No obstetric history on file.     No family history on file.  Social History   Tobacco Use   Smoking status: Every Day   Smokeless tobacco: Never  Vaping Use   Vaping Use: Never used  Substance Use Topics   Alcohol use: Not Currently   Drug use: Not Currently    Home Medications Prior to Admission medications   Medication Sig Start Date End Date Taking? Authorizing Provider  Acetaminophen 500 MG coapsule Take 1,000 mg by mouth every 6 (six) hours as needed.    [provider]  cyclobenzaprine (FLEXERIL) 10 MG tablet Take 1 tablet (10 mg total) by mouth 2 (two) times daily as needed for muscle spasms. 08/12/21   Theron Arista, PA-C  dicyclomine (BENTYL) 20 MG tablet Take 1 tablet (20 mg total) by mouth 2 (two) times daily. 03/19/19   Caccavale, Sophia, PA-C   HYDROcodone-acetaminophen (NORCO/VICODIN) 5-325 MG tablet Take 1-2 tablets by mouth every 6 (six) hours as needed. 05/26/19   Benjiman Core, MD    Allergies    Bee venom, Ibuprofen, Klonopin [clonazepam], Morphine and related, Sulfa antibiotics, and Tramadol  Review of Systems   Review of Systems  Physical Exam Updated Vital Signs BP 123/78 (BP Location: Right Arm)    Pulse 89    Temp 97.8 F (36.6 C) (Oral)    Resp 18    Ht 5\' 1"  (1.549 m)    Wt 77.1 kg    SpO2 100%    BMI 32.12 kg/m   Physical Exam Constitutional:      General: She is not in acute distress.    Appearance: She is not ill-appearing.  Cardiovascular:     Pulses: Normal pulses.  Musculoskeletal:        General: No swelling or tenderness. Normal range of motion.     Cervical back: Normal range of motion. No tenderness.  Skin:    General: Skin is warm.  Neurological:     Mental Status: She is alert.     Sensory: No sensory deficit.     Motor: No weakness.    ED Results / Procedures / Treatments   Labs (all labs ordered are listed, but only abnormal results  are displayed) Labs Reviewed - No data to display  EKG None  Radiology No results found.  Procedures Procedures   Medications Ordered in ED Medications - No data to display  ED Course  I have reviewed the triage vital signs and the nursing notes.  Pertinent labs & imaging results that were available during my care of the patient were reviewed by me and considered in my medical decision making (see chart for details).    MDM Rules/Calculators/A&P                          Gwendolyn Hampton is here for work clearance.  Patient with normal vitals.  No fever.  Injured her left shoulder last week after an object hit her in the left shoulder.  She had x-ray done at that time that was negative for fracture.  She tried to go back to work the next day and was having too much discomfort.  She was given the week off and then told to get medical  clearance before going back to work.  Overall she is been pain-free.  She is neurovascular neuromuscularly intact.  Suspect that she likely had a contusion given the mechanism.  It is possible that she might have some sort of soft tissue injury as well.  However she is got good range of motion without much discomfort.  She feels confident that she is ready to get back to work and I think that is reasonable however we will give her information to follow-up with sports medicine if she continues to have discomfort with work.  Recommend continued use of Tylenol, ice, ibuprofen.  Discharged in good condition.  Neurologically she is intact.  She is got good pulses good strength and good sensation.  She has no midline spinal pain.  This chart was dictated using voice recognition software.  Despite best efforts to proofread,  errors can occur which can change the documentation meaning.      Final Clinical Impression(s) / ED Diagnoses Final diagnoses:  Acute pain of left shoulder    Rx / DC Orders ED Discharge Orders     None        Virgina Norfolk, DO 08/19/21 2141

## 2021-09-16 ENCOUNTER — Other Ambulatory Visit: Payer: Self-pay

## 2021-09-16 ENCOUNTER — Emergency Department (HOSPITAL_COMMUNITY)
Admission: EM | Admit: 2021-09-16 | Discharge: 2021-09-16 | Disposition: A | Discharge status: Home / Self Care | Payer: Managed Care, Other (non HMO) | Attending: Emergency Medicine | Admitting: Emergency Medicine

## 2021-09-16 ENCOUNTER — Encounter (HOSPITAL_COMMUNITY): Payer: Self-pay

## 2021-09-16 DIAGNOSIS — Z5321 Procedure and treatment not carried out due to patient leaving prior to being seen by health care provider: Secondary | ICD-10-CM | POA: Diagnosis not present

## 2021-09-16 DIAGNOSIS — K6289 Other specified diseases of anus and rectum: Secondary | ICD-10-CM | POA: Diagnosis not present

## 2021-09-16 NOTE — ED Provider Triage Note (Signed)
Emergency Medicine Provider Triage Evaluation Note  Demeka Sutter , a 36 y.o. female  was evaluated in triage.  Pt complains of rectal pain.  He states that her rectum is coming out, she was at a hospital in [facility] and was supposed to go to surgery today however her mother died and she had to leave the hospital and could not have her surgery.  She states before leaving, they did put her rectum back in.  Review of Systems  Positive: Rectal pain Negative: Fever, nausea, vomiting  Physical Exam  BP (!) 150/128 (BP Location: Right Arm)    Pulse (!) 107    Temp 98.9 F (37.2 C) (Oral)    Resp (!) 22    Ht 5\' 1"  (1.549 m)    Wt 78.9 kg    SpO2 99%    BMI 32.88 kg/m  Gen:   Awake, appears uncomfortable Resp:  Normal effort  MSK:   Moves extremities without difficulty  Other:  No rectal prolapse at this time  Medical Decision Making  Medically screening exam initiated at 9:18 PM.  Appropriate orders placed.  Savilla Turbyfill was informed that the remainder of the evaluation will be completed by another provider, this initial triage assessment does not replace that evaluation, and the importance of remaining in the ED until their evaluation is complete.     Carita Pian, PA-C 09/16/21 2122

## 2021-09-16 NOTE — ED Triage Notes (Signed)
Patient said her rectum is coming out of her bottom. This happened yesterday. She was in the hospital yesterday but her mom passed. They were going to do surgery but they left.

## 2022-05-14 ENCOUNTER — Encounter: Payer: Self-pay | Admitting: Emergency Medicine

## 2022-05-14 ENCOUNTER — Emergency Department
Admission: EM | Admit: 2022-05-14 | Discharge: 2022-05-14 | Disposition: A | Discharge status: Home / Self Care | Payer: 59 | Attending: Emergency Medicine | Admitting: Emergency Medicine

## 2022-05-14 ENCOUNTER — Other Ambulatory Visit: Payer: Self-pay

## 2022-05-14 DIAGNOSIS — M7989 Other specified soft tissue disorders: Secondary | ICD-10-CM | POA: Diagnosis not present

## 2022-05-14 DIAGNOSIS — L02213 Cutaneous abscess of chest wall: Secondary | ICD-10-CM | POA: Diagnosis present

## 2022-05-14 DIAGNOSIS — L0291 Cutaneous abscess, unspecified: Secondary | ICD-10-CM

## 2022-05-14 MED ORDER — DOXYCYCLINE MONOHYDRATE 100 MG PO TABS
100.0000 mg | ORAL_TABLET | Freq: Two times a day (BID) | ORAL | 0 refills | Status: AC
Start: 2022-05-14 — End: 2022-05-19

## 2022-05-14 MED ORDER — DOXYCYCLINE HYCLATE 100 MG PO TABS
100.0000 mg | ORAL_TABLET | Freq: Once | ORAL | Status: AC
Start: 2022-05-14 — End: 2022-05-14
  Administered 2022-05-14: 100 mg via ORAL
  Filled 2022-05-14: qty 1

## 2022-05-14 MED ORDER — OXYCODONE-ACETAMINOPHEN 5-325 MG PO TABS
1.0000 | ORAL_TABLET | Freq: Once | ORAL | Status: AC
  Administered 2022-05-14: 1 via ORAL
  Filled 2022-05-14: qty 1

## 2022-05-14 NOTE — Discharge Instructions (Signed)
Please take the antibiotic twice daily for the next 5 days. You can tylenol and motrin for pain.  If you notice increasing redness swelling or increasing amount of drainage please return to emergency department.

## 2022-05-14 NOTE — ED Provider Notes (Signed)
[facility] Provider Note    Event Date/Time   First MD Initiated Contact with Patient 05/14/22 1717     (approximate)   History   Abscess   HPI  Gwendolyn Hampton is a 36 y.o. female with past medical history of bipolar disorder who presents with pain and swelling of the left arm.  Patient tells me she has had a cyst in this area for many years.  Over the last week it has expanded and become more painful.  Denies fevers chills or other systemic symptoms.  She tells me that when she was in prison she had it biopsied and was told it was not cancer.     History reviewed. No pertinent past medical history.  There are no problems to display for this patient.    Physical Exam  Triage Vital Signs: ED Triage Vitals [05/14/22 1712]  Enc Vitals Group     BP      Pulse      Resp      Temp      Temp src      SpO2      Weight      Height      Head Circumference      Peak Flow      Pain Score 7     Pain Loc      Pain Edu?      Excl. in GC?     Most recent vital signs: Vitals:   05/14/22 1722  BP: 116/63  Pulse: 68  Resp: 18  Temp: 98 F (36.7 C)  SpO2: 95%     General: Awake, no distress.  CV:  Good peripheral perfusion.  Resp:  Normal effort.  Abd:  No distention.  Neuro:             Awake, Alert, Oriented x 3  Other:  Large area of fluctuance in the inferior portion of the left axilla with overlying erythema, is tender to palpation, sinus approximately 3 x 6 cm   ED Results / Procedures / Treatments  Labs (all labs ordered are listed, but only abnormal results are displayed) Labs Reviewed - No data to display   EKG     RADIOLOGY Bedside ultrasound performed by myself shows abscess cavity about 3 cm deep   PROCEDURES:  Critical Care performed: No  ..Incision and Drainage  Date/Time: 05/14/2022 6:24 PM  Performed by: Georga Hacking, MD Authorized by: Georga Hacking, MD   Consent:    Consent obtained:   Verbal Universal protocol:    Patient identity confirmed:  Verbally with patient Location:    Type:  Abscess   Size:  3x6cm   Location:  Trunk   Trunk location:  Chest Pre-procedure details:    Skin preparation:  Povidone-iodine Sedation:    Sedation type:  None Anesthesia:    Anesthesia method:  Local infiltration   Local anesthetic:  Lidocaine 2% WITH epi Procedure type:    Complexity:  Simple Procedure details:    Ultrasound guidance: no     Needle aspiration: no     Incision types:  Single straight   Incision depth:  Dermal   Wound management:  Probed and deloculated   Drainage:  Purulent   Drainage amount:  Copious   Wound treatment:  Wound left open   Packing materials:  None Post-procedure details:    Procedure completion:  Tolerated    MEDICATIONS ORDERED IN ED: Medications  doxycycline (VIBRA-TABS) tablet  100 mg (has no administration in time range)  oxyCODONE-acetaminophen (PERCOCET/ROXICET) 5-325 MG per tablet 1 tablet (1 tablet Oral Given 05/14/22 1734)     IMPRESSION / MDM / ASSESSMENT AND PLAN / ED COURSE  I reviewed the triage vital signs and the nursing notes.                              Patient's presentation is most consistent with acute, uncomplicated illness.  Differential diagnosis includes, but is not limited to, abscess, infected cyst, cellulitis  Patient is a 36 year old female presents with an abscess in her left axilla.  Tells me she has had a cyst in this area for quite some time but over the last week it has become more painful and gotten larger.  There is a fairly sizable area of fluctuance that is ovoid shaped about 3 x 6 cm.  I performed a bedside ultrasound which is consistent with abscess and its about 3 cm deep.  Incision and drainage performed with return of copious purulence.  I recommended that we pack it however patient adamantly refuses that she is had wound packed before and did not want to have this done again.  Will prescribe 5  days of doxycycline.  Discussed return precautions.  Recommended Tylenol Motrin for pain.       FINAL CLINICAL IMPRESSION(S) / ED DIAGNOSES   Final diagnoses:  Abscess     Rx / DC Orders   ED Discharge Orders     None        Note:  This document was prepared using Dragon voice recognition software and may include unintentional dictation errors.   Georga Hacking, MD 05/14/22 [phone]

## 2022-05-14 NOTE — ED Triage Notes (Signed)
Pt to ED via POV for abscess under her left arm. Pt is in NAD.

## 2022-10-18 ENCOUNTER — Emergency Department (HOSPITAL_COMMUNITY)
Admission: EM | Admit: 2022-10-18 | Discharge: 2022-10-18 | Disposition: A | Discharge status: Home / Self Care | Payer: Self-pay | Attending: Emergency Medicine | Admitting: Emergency Medicine

## 2022-10-18 ENCOUNTER — Emergency Department (HOSPITAL_COMMUNITY): Payer: Self-pay

## 2022-10-18 ENCOUNTER — Other Ambulatory Visit: Payer: Self-pay

## 2022-10-18 DIAGNOSIS — W19XXXA Unspecified fall, initial encounter: Secondary | ICD-10-CM

## 2022-10-18 DIAGNOSIS — W1830XA Fall on same level, unspecified, initial encounter: Secondary | ICD-10-CM | POA: Insufficient documentation

## 2022-10-18 DIAGNOSIS — S39012A Strain of muscle, fascia and tendon of lower back, initial encounter: Secondary | ICD-10-CM | POA: Insufficient documentation

## 2022-10-18 MED ORDER — ACETAMINOPHEN 500 MG PO CAPS: 1000.0000 mg | ORAL_CAPSULE | Freq: Four times a day (QID) | ORAL | 0 refills | Status: DC | PRN

## 2022-10-18 MED ORDER — ACETAMINOPHEN 500 MG PO TABS
1000.0000 mg | ORAL_TABLET | Freq: Once | ORAL | Status: AC
  Administered 2022-10-18: 1000 mg via ORAL
  Filled 2022-10-18: qty 2

## 2022-10-18 MED ORDER — CYCLOBENZAPRINE HCL 10 MG PO TABS: 10.0000 mg | ORAL_TABLET | Freq: Two times a day (BID) | ORAL | 0 refills | Status: DC | PRN

## 2022-10-18 NOTE — ED Triage Notes (Signed)
Fall yesterday while trying to pick up package from outside house while carrying package up steps. Patient reports falling backwards down 4 steps. Pain in buttocks/tailbone/back

## 2022-10-18 NOTE — ED Provider Notes (Signed)
[facility] Provider Note   CSN: [id number] Arrival date & time: 10/18/22  [id number]     History  Chief Complaint  Patient presents with   Lytle Michaels    Gwendolyn Hampton is a 37 y.o. female.  The history is provided by the patient and medical records. No language interpreter was used.  Fall     37 year old female significant history of IV drug use presenting for evaluation of a fall.  Patient report yesterday she was picking up a package from her steps outside her house.  She was walking up the steps, she lost balance and fell backward striking her head and her back to against the ground.  She denies any loss of consciousness but does complain of pain throughout her lower back towards her tailbone and down her legs bilaterally.  Pain is moderate in severity, not improved with ibuprofen that she took earlier today.  She denies any numbness no bowel bladder incontinence or saddle anesthesia no headache or neck pain.  She denies any recent IV drug use.  Home Medications Prior to Admission medications   Medication Sig Start Date End Date Taking? Authorizing Provider  Acetaminophen 500 MG coapsule Take 1,000 mg by mouth every 6 (six) hours as needed.    [provider]  cyclobenzaprine (FLEXERIL) 10 MG tablet Take 1 tablet (10 mg total) by mouth 2 (two) times daily as needed for muscle spasms. 08/12/21   Sherrill Raring, PA-C  dicyclomine (BENTYL) 20 MG tablet Take 1 tablet (20 mg total) by mouth 2 (two) times daily. 03/19/19   Caccavale, Sophia, PA-C  HYDROcodone-acetaminophen (NORCO/VICODIN) 5-325 MG tablet Take 1-2 tablets by mouth every 6 (six) hours as needed. 05/26/19   Davonna Belling, MD      Allergies    Bee venom, Ibuprofen, Klonopin [clonazepam], Morphine and related, Sulfa antibiotics, and Tramadol    Review of Systems   Review of Systems  All other systems reviewed and are negative.   Physical Exam Updated Vital Signs BP 108/75  (BP Location: Right Arm)   Pulse 69   Temp 97.6 F (36.4 C) (Oral)   Resp 18   SpO2 98%  Physical Exam Vitals and nursing note reviewed.  Constitutional:      General: She is not in acute distress.    Appearance: She is well-developed. She is obese.  HENT:     Head: Atraumatic.     Comments: No scalp tenderness no cervical midline spine tenderness Eyes:     Conjunctiva/sclera: Conjunctivae normal.  Cardiovascular:     Heart sounds: No murmur heard. Pulmonary:     Effort: Pulmonary effort is normal.  Abdominal:     Palpations: Abdomen is soft.     Tenderness: There is no abdominal tenderness.  Musculoskeletal:        General: Tenderness (Tenderness to lumbar and paralumbar spinal muscle without any bruising crepitus all over skin changes.) present.     Cervical back: Neck supple.     Comments: 5 out of 5 strength to bilateral lower extremities.  Skin:    Findings: No rash.     Comments: Multiple track marks noted throughout both forearms and hands without any signs of infection.  Neurological:     Mental Status: She is alert.  Psychiatric:        Mood and Affect: Mood normal.     ED Results / Procedures / Treatments   Labs (all labs ordered are listed, but only abnormal results  are displayed) Labs Reviewed - No data to display  EKG None  Radiology DG Lumbar Spine Complete  Result Date: 10/18/2022 CLINICAL DATA:  [place].  Back pain. EXAM: LUMBAR SPINE - COMPLETE 4+ VIEW COMPARISON:  None Available. FINDINGS: Normal alignment of the lumbar vertebral bodies. Disc spaces and vertebral bodies are maintained. The facets are normally aligned. No pars defects. The visualized bony pelvis is intact. IMPRESSION: Normal alignment and no acute bony findings. Electronically Signed   By: Marijo Sanes M.D.   On: 10/18/2022 09:40    Procedures Procedures    Medications Ordered in ED Medications  acetaminophen (TYLENOL) tablet 1,000 mg (1,000 mg Oral Given 10/18/22 0906)    ED  Course/ Medical Decision Making/ A&P                             Medical Decision Making Amount and/or Complexity of Data Reviewed Radiology: ordered.  Risk OTC drugs.   BP 108/75 (BP Location: Right Arm)   Pulse 69   Temp 97.6 F (36.4 C) (Oral)   Resp 18   SpO2 98%   25:60 AM 37 year old female significant history of IV drug use presenting for evaluation of a fall.  Patient report yesterday she was picking up a package from her steps outside her house.  She was walking up the steps, she lost balance and fell backward striking her head and her back to against the ground.  She denies any loss of consciousness but does complain of pain throughout her lower back towards her tailbone and down her legs bilaterally.  Pain is moderate in severity, not improved with ibuprofen that she took earlier today.  She denies any numbness no bowel bladder incontinence or saddle anesthesia no headache or neck pain.  She denies any recent IV drug use.  On exam this is an obese female appears slightly uncomfortable laying in bed.  She does not have any signs of scalp injury or tenderness about her scalp and neck.  She has tenderness along her lumbar and paralumbar spinal muscle without any crepitus or step-off and no bruising noted.  She has equal strength to bilateral lower extremities.  She also has multiple track marks noted to bilateral forearms and dorsums of hands without signs of infection.  Heart exam without any murmurs.  Vital sign review and overall reassuring no fever no hypoxia.  Patient's fall is likely mechanical, x-ray ordered to rule out fracture or dislocation.  Tylenol given for pain.  Patient has had prior hysterectomy therefore pregnancy test is not indicated.  9:52 AM Lumbar spine x-ray was obtained independently viewed interpreted by me and I agree with radiology interpretation.  Fortunately x-ray did not show any acute bony abnormalities.  I discussed care with patient, will discharge  home with supportive care and orthopedic referral given as needed.  Return precaution given.  Patient report minimal improvement with Tylenol.  Differential today includes fracture dislocation strain sprain contusion cauda equina spinal infection        Final Clinical Impression(s) / ED Diagnoses Final diagnoses:  Fall, initial encounter  Lumbar strain, initial encounter    Rx / DC Orders ED Discharge Orders          Ordered    Acetaminophen 500 MG capsule  Every 6 hours PRN        10/18/22 1004    cyclobenzaprine (FLEXERIL) 10 MG tablet  2 times daily PRN  10/18/22 1004              Domenic Moras, PA-C 10/18/22 1007    Gareth Morgan, MD 10/19/22 [phone]

## 2022-10-24 ENCOUNTER — Emergency Department
Admission: EM | Admit: 2022-10-24 | Discharge: 2022-10-24 | Disposition: A | Discharge status: Home / Self Care | Payer: Self-pay | Attending: Emergency Medicine | Admitting: Emergency Medicine

## 2022-10-24 DIAGNOSIS — M5441 Lumbago with sciatica, right side: Secondary | ICD-10-CM | POA: Insufficient documentation

## 2022-10-24 MED ORDER — CYCLOBENZAPRINE HCL 10 MG PO TABS
5.0000 mg | ORAL_TABLET | Freq: Once | ORAL | Status: AC
  Administered 2022-10-24: 5 mg via ORAL
  Filled 2022-10-24: qty 1

## 2022-10-24 MED ORDER — CYCLOBENZAPRINE HCL 5 MG PO TABS: 5.0000 mg | ORAL_TABLET | Freq: Three times a day (TID) | ORAL | 0 refills | Status: AC | PRN

## 2022-10-24 MED ORDER — DEXAMETHASONE SODIUM PHOSPHATE 10 MG/ML IJ SOLN
10.0000 mg | Freq: Once | INTRAMUSCULAR | Status: AC
  Administered 2022-10-24: 10 mg via INTRAMUSCULAR
  Filled 2022-10-24: qty 1

## 2022-10-24 MED ORDER — PREDNISONE 10 MG (21) PO TBPK: ORAL_TABLET | ORAL | 0 refills | Status: DC

## 2022-10-24 NOTE — ED Provider Notes (Signed)
[facility] Provider Note  Patient Contact: 10:53 PM (approximate)   History   Back Pain   HPI  Gwendolyn Hampton is a 37 y.o. female with a history of recent fall, presents to the emergency department with persistent low back pain that radiates down the posterior aspect of the lower extremity.  Patient has already had a lumbar spine x-ray which shows no acute abnormality.  No bowel or bladder incontinence or saddle anesthesia.      Physical Exam   Triage Vital Signs: ED Triage Vitals  Enc Vitals Group     BP 10/24/22 2119 118/71     Pulse Rate 10/24/22 2119 71     Resp 10/24/22 2119 16     Temp 10/24/22 2119 98.2 F (36.8 C)     Temp Source 10/24/22 2119 Oral     SpO2 10/24/22 2119 98 %     Weight 10/24/22 2120 175 lb (79.4 kg)     Height 10/24/22 2120 5' 1"$  (1.549 m)     Head Circumference --      Peak Flow --      Pain Score 10/24/22 2120 10     Pain Loc --      Pain Edu? --      Excl. in [place]? --     Most recent vital signs: Vitals:   10/24/22 2119  BP: 118/71  Pulse: 71  Resp: 16  Temp: 98.2 F (36.8 C)  SpO2: 98%     General: Alert and in no acute distress. Eyes:  PERRL. EOMI. Head: No acute traumatic findings ENT:      Nose: No congestion/rhinnorhea.      Mouth/Throat: Mucous membranes are moist. Neck: No stridor. No cervical spine tenderness to palpation. Cardiovascular:  Good peripheral perfusion Respiratory: Normal respiratory effort without tachypnea or retractions. Lungs CTAB. Good air entry to the bases with no decreased or absent breath sounds. Gastrointestinal: Bowel sounds 4 quadrants. Soft and nontender to palpation. No guarding or rigidity. No palpable masses. No distention. No CVA tenderness. Musculoskeletal: Full range of motion to all extremities.  Positive straight leg raise, right. Neurologic:  No gross focal neurologic deficits are appreciated.  Skin:   No rash noted Other:   ED Results / Procedures /  Treatments   Labs (all labs ordered are listed, but only abnormal results are displayed) Labs Reviewed - No data to display      PROCEDURES:  Critical Care performed: No  Procedures   MEDICATIONS ORDERED IN ED: Medications  cyclobenzaprine (FLEXERIL) tablet 5 mg (has no administration in time range)  dexamethasone (DECADRON) injection 10 mg (has no administration in time range)     IMPRESSION / MDM / ASSESSMENT AND PLAN / ED COURSE  I reviewed the triage vital signs and the nursing notes.                              Assessment and plan Low back pain 37 year old female presents to the emergency department with persistent low back pain with right lower extremity radiculopathy.  Vital signs reassuring at triage.  On exam, patient alert and nontoxic-appearing.  Will give patient an injection of Decadron in the emergency department and will transition her to tapered prednisone and Flexeril at night before bed as patient states that she is out of her muscle relaxer.  Return precautions were given to return with new or worsening symptoms.  All patient questions  were answered.      FINAL CLINICAL IMPRESSION(S) / ED DIAGNOSES   Final diagnoses:  Acute bilateral low back pain with right-sided sciatica     Rx / DC Orders   ED Discharge Orders          Ordered    predniSONE (STERAPRED UNI-PAK 21 TAB) 10 MG (21) TBPK tablet        10/24/22 2247    cyclobenzaprine (FLEXERIL) 5 MG tablet  3 times daily PRN        10/24/22 2247             Note:  This document was prepared using Dragon voice recognition software and may include unintentional dictation errors.   Vallarie Mare [place], PA-C 10/24/22 2255    Carrie Mew, MD 10/24/22 [phone]

## 2022-10-24 NOTE — ED Triage Notes (Signed)
Mechanical fall 02/14 lifting package. Fell onto [employer] and has been having back pain since. Pt reports being seen at other [facility] facilities with no relief in pain. Has not followed up with referrals provided for spine followup.

## 2022-10-24 NOTE — Discharge Instructions (Signed)
Take tapered prednisone as directed. You can take Flexeril at night before bed.

## 2022-11-13 ENCOUNTER — Emergency Department
Admission: EM | Admit: 2022-11-13 | Discharge: 2022-11-13 | Disposition: A | Discharge status: Home / Self Care | Payer: Self-pay | Attending: Emergency Medicine | Admitting: Emergency Medicine

## 2022-11-13 ENCOUNTER — Emergency Department: Payer: Self-pay

## 2022-11-13 ENCOUNTER — Other Ambulatory Visit: Payer: Self-pay

## 2022-11-13 DIAGNOSIS — M7918 Myalgia, other site: Secondary | ICD-10-CM

## 2022-11-13 DIAGNOSIS — R109 Unspecified abdominal pain: Secondary | ICD-10-CM | POA: Insufficient documentation

## 2022-11-13 DIAGNOSIS — M79604 Pain in right leg: Secondary | ICD-10-CM | POA: Diagnosis not present

## 2022-11-13 DIAGNOSIS — R202 Paresthesia of skin: Secondary | ICD-10-CM | POA: Diagnosis not present

## 2022-11-13 DIAGNOSIS — M25551 Pain in right hip: Secondary | ICD-10-CM | POA: Diagnosis not present

## 2022-11-13 DIAGNOSIS — S39012A Strain of muscle, fascia and tendon of lower back, initial encounter: Secondary | ICD-10-CM | POA: Insufficient documentation

## 2022-11-13 DIAGNOSIS — Y92410 Unspecified street and highway as the place of occurrence of the external cause: Secondary | ICD-10-CM | POA: Diagnosis not present

## 2022-11-13 DIAGNOSIS — S3992XA Unspecified injury of lower back, initial encounter: Secondary | ICD-10-CM | POA: Diagnosis present

## 2022-11-13 MED ORDER — CYCLOBENZAPRINE HCL 10 MG PO TABS
5.0000 mg | ORAL_TABLET | Freq: Once | ORAL | Status: AC
  Administered 2022-11-13: 5 mg via ORAL
  Filled 2022-11-13: qty 1

## 2022-11-13 MED ORDER — CYCLOBENZAPRINE HCL 10 MG PO TABS: 10.0000 mg | ORAL_TABLET | Freq: Three times a day (TID) | ORAL | 0 refills | Status: DC | PRN

## 2022-11-13 NOTE — Discharge Instructions (Signed)
Please use ibuprofen (Motrin) up to 800 mg every 8 hours, naproxen (Naprosyn) up to 500 mg every 12 hours, and/or acetaminophen (Tylenol) up to 4 g/day for any continued pain.  Please do not use this medication regimen for longer than 7 days 
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
## 2022-12-20 ENCOUNTER — Encounter (HOSPITAL_COMMUNITY): Payer: Self-pay

## 2022-12-20 ENCOUNTER — Other Ambulatory Visit: Payer: Self-pay

## 2022-12-20 ENCOUNTER — Emergency Department (HOSPITAL_COMMUNITY): Payer: Self-pay

## 2022-12-20 ENCOUNTER — Emergency Department (HOSPITAL_COMMUNITY)
Admission: EM | Admit: 2022-12-20 | Discharge: 2022-12-20 | Disposition: A | Discharge status: Home / Self Care | Payer: Self-pay | Attending: Emergency Medicine | Admitting: Emergency Medicine

## 2022-12-20 DIAGNOSIS — R109 Unspecified abdominal pain: Secondary | ICD-10-CM | POA: Insufficient documentation

## 2022-12-20 LAB — COMPREHENSIVE METABOLIC PANEL
ALT: 45 U/L — ABNORMAL HIGH (ref 0–44)
AST: 51 U/L — ABNORMAL HIGH (ref 15–41)
Albumin: 3.2 g/dL — ABNORMAL LOW (ref 3.5–5.0)
Alkaline Phosphatase: 62 U/L (ref 38–126)
Anion gap: 8 (ref 5–15)
BUN: 5 mg/dL — ABNORMAL LOW (ref 6–20)
CO2: 24 mmol/L (ref 22–32)
Calcium: 8.8 mg/dL — ABNORMAL LOW (ref 8.9–10.3)
Chloride: 104 mmol/L (ref 98–111)
Creatinine, Ser: 0.75 mg/dL (ref 0.44–1.00)
GFR, Estimated: >60 mL/min (ref >60)
Glucose, Bld: 91 mg/dL (ref 70–99)
Potassium: 3.5 mmol/L (ref 3.5–5.1)
Sodium: 136 mmol/L (ref 135–145)
Total Bilirubin: 0.3 mg/dL (ref 0.3–1.2)
Total Protein: 6.2 g/dL — ABNORMAL LOW (ref 6.5–8.1)

## 2022-12-20 LAB — URINALYSIS, W/ REFLEX TO CULTURE (INFECTION SUSPECTED)
Bilirubin Urine: NEGATIVE
Glucose, UA: NEGATIVE mg/dL
Ketones, ur: NEGATIVE mg/dL
Leukocytes,Ua: NEGATIVE
Nitrite: NEGATIVE
Protein, ur: NEGATIVE mg/dL
Specific Gravity, Urine: 1.009 (ref 1.005–1.030)
pH: 5 (ref 5.0–8.0)

## 2022-12-20 LAB — CBC
HCT: 36.4 % (ref 36.0–46.0)
Hemoglobin: 12.1 g/dL (ref 12.0–15.0)
MCH: 29.2 pg (ref 26.0–34.0)
MCHC: 33.2 g/dL (ref 30.0–36.0)
MCV: 87.9 fL (ref 80.0–100.0)
Platelets: 172 10*3/uL (ref 150–400)
RBC: 4.14 MIL/uL (ref 3.87–5.11)
RDW: 12.3 % (ref 11.5–15.5)
WBC: 5.8 10*3/uL (ref 4.0–10.5)
nRBC: 0 % (ref 0.0–0.2)

## 2022-12-20 LAB — LIPASE, BLOOD: Lipase: 28 U/L (ref 11–51)

## 2022-12-20 MED ORDER — ONDANSETRON 4 MG PO TBDP: ORAL_TABLET | ORAL | 0 refills | Status: DC

## 2022-12-20 MED ORDER — HYDROCODONE-ACETAMINOPHEN 5-325 MG PO TABS
1.0000 | ORAL_TABLET | Freq: Once | ORAL | Status: AC
  Administered 2022-12-20: 1 via ORAL
  Filled 2022-12-20: qty 1

## 2022-12-20 MED ORDER — ONDANSETRON 4 MG PO TBDP
8.0000 mg | ORAL_TABLET | Freq: Once | ORAL | Status: AC
  Administered 2022-12-20: 8 mg via ORAL
  Filled 2022-12-20: qty 2

## 2022-12-20 NOTE — ED Provider Triage Note (Signed)
Emergency Medicine Provider Triage Evaluation Note  Gwendolyn Hampton , a 37 y.o. female  was evaluated in triage.  Pt complains of right flank pain.  Patient states symptoms began insidiously approximately 3 days ago when she was sitting at home.  Reports intermittent intensity since onset.  States she has a history of kidney stones and states this feels similarly.  Reports fever at home yesterday of 103 with feelings of nausea and episodes of emesis nonbloody in appearance.  Denies vaginal symptoms, change in bowel habits.  Review of Systems  Positive: See above Negative:   Physical Exam  BP 109/72   Pulse (!) 56   Temp 98.2 F (36.8 C)   Resp 14   SpO2 95%  Gen:   Awake, no distress   Resp:  Normal effort  MSK:   Moves extremities without difficulty  Other:  Right CVA tenderness, right lower abdominal tenderness.  Medical Decision Making  Medically screening exam initiated at 9:15 AM.  Appropriate orders placed.  Gwendolyn Hampton was informed that the remainder of the evaluation will be completed by another provider, this initial triage assessment does not replace that evaluation, and the importance of remaining in the ED until their evaluation is complete.     Peter Garter, [place] 12/20/22 [phone]

## 2022-12-20 NOTE — ED Triage Notes (Signed)
Pt came in via POV d/t Rt sided abd pain for the past for 3 days, pt reports it wakes her out of sleep when it gets worse. Rates pain 8/10, & said it will radiates from Rt side abd to her back at times. Reports hematuria, feeling shaky & vomiting & fever yesterday. Hx of kidney stones.

## 2022-12-20 NOTE — ED Provider Notes (Signed)
[place] EMERGENCY DEPARTMENT AT [facility] Provider Note   CSN: [id number] Arrival date & time: 12/20/22  4098     History  Chief Complaint  Patient presents with   Rt sided Abd Pain    Gwendolyn Hampton is a 37 y.o. female.  37 yo F with a chief complaint of right-sided abdominal discomfort.  This been going on for 2 or 3 days.  It is painful feels like it starts in the right side abdomen and radiates to the back.  Felt she had some blood in her urine but denies any obvious pain with urination.  She was wondering if maybe it was a kidney stone or perhaps her appendix.  No fevers no vomiting no change to bowel or bladder no vaginal bleeding or discharge.        Home Medications Prior to Admission medications   Medication Sig Start Date End Date Taking? Authorizing Provider  ondansetron (ZOFRAN-ODT) 4 MG disintegrating tablet  ODT q4 hours prn nausea/vomit 12/20/22  Yes Melene Plan, DO  Acetaminophen 500 MG capsule Take 2 capsules (1,000 mg total) by mouth every 6 (six) hours as needed. 10/18/22   Fayrene Helper, PA-C  cyclobenzaprine (FLEXERIL) 10 MG tablet Take 1 tablet (10 mg total) by mouth 3 (three) times daily as needed for muscle spasms. 11/13/22   Merwyn Katos, MD  dicyclomine (BENTYL) 20 MG tablet Take 1 tablet (20 mg total) by mouth 2 (two) times daily. 03/19/19   Caccavale, Sophia, PA-C  HYDROcodone-acetaminophen (NORCO/VICODIN) 5-325 MG tablet Take 1-2 tablets by mouth every 6 (six) hours as needed. 05/26/19   Benjiman Core, MD  predniSONE (STERAPRED UNI-PAK 21 TAB) 10 MG (21) TBPK tablet 6,5,4,3,2,1 10/24/22   Pia Mau M, PA-C      Allergies    Bee venom, Ibuprofen, Klonopin [clonazepam], Morphine and related, Sulfa antibiotics, and Tramadol    Review of Systems   Review of Systems  Physical Exam Updated Vital Signs BP 109/72   Pulse (!) 56   Temp 98.2 F (36.8 C)   Resp 14   SpO2 95%  Physical Exam Vitals and nursing note reviewed.   Constitutional:      General: She is not in acute distress.    Appearance: She is well-developed. She is not diaphoretic.  HENT:     Head: Normocephalic and atraumatic.  Eyes:     Pupils: Pupils are equal, round, and reactive to light.  Cardiovascular:     Rate and Rhythm: Normal rate and regular rhythm.     Heart sounds: No murmur heard.    No friction rub. No gallop.  Pulmonary:     Effort: Pulmonary effort is normal.     Breath sounds: No wheezing or rales.  Abdominal:     General: There is no distension.     Palpations: Abdomen is soft.     Tenderness: There is abdominal tenderness.     Comments: Pain diffusely about the abdomen but worse to the right side mostly in the lower quadrant.  No rash no obvious midline spinal tenderness step-offs or deformities.  Musculoskeletal:        General: No tenderness.     Cervical back: Normal range of motion and neck supple.     Comments: Pulse motor and sensation intact to the right lower extremity reflexes 2+ and equal no clonus straight leg raise test without eliciting discomfort.  Skin:    General: Skin is warm and dry.  Neurological:  Mental Status: She is alert and oriented to person, place, and time.  Psychiatric:        Behavior: Behavior normal.     ED Results / Procedures / Treatments   Labs (all labs ordered are listed, but only abnormal results are displayed) Labs Reviewed  COMPREHENSIVE METABOLIC PANEL - Abnormal; Notable for the following components:      Result Value   BUN 5 (*)    Calcium 8.8 (*)    Total Protein 6.2 (*)    Albumin 3.2 (*)    AST 51 (*)    ALT 45 (*)    All other components within normal limits  URINALYSIS, W/ REFLEX TO CULTURE (INFECTION SUSPECTED) - Abnormal; Notable for the following components:   Hgb urine dipstick LARGE (*)    Bacteria, UA RARE (*)    All other components within normal limits  LIPASE, BLOOD  CBC    EKG None  Radiology CT Renal Stone Study  Result Date:  12/20/2022 CLINICAL DATA:  Right flank pain EXAM: CT ABDOMEN AND PELVIS WITHOUT CONTRAST TECHNIQUE: Multidetector CT imaging of the abdomen and pelvis was performed following the standard protocol without IV contrast. RADIATION DOSE REDUCTION: This exam was performed according to the departmental dose-optimization program which includes automated exposure control, adjustment of the mA and/or kV according to patient size and/or use of iterative reconstruction technique. COMPARISON:  11/13/2022 FINDINGS: Lower chest: Included lung bases are clear.  Heart size is normal. Hepatobiliary: No focal liver abnormality is seen. Status post cholecystectomy. No biliary dilatation. Pancreas: Unremarkable. No pancreatic ductal dilatation or surrounding inflammatory changes. Spleen: Normal in size without focal abnormality. Adrenals/Urinary Tract: Adrenal glands are unremarkable. Kidneys are normal, without renal calculi, focal lesion, or hydronephrosis. No ureteral calculi. Bladder is unremarkable for the degree of distension. Several vascular phleboliths within the pelvis, which are unchanged. Stomach/Bowel: Stomach is within normal limits. No evidence of appendicitis. No evidence of bowel wall thickening, distention, or inflammatory changes. Moderate volume stool throughout the colon. Vascular/Lymphatic: No significant vascular findings are present. No enlarged abdominal or pelvic lymph nodes. Reproductive: Status post hysterectomy. No adnexal masses. Other: Small volume free fluid within the cul-de-sac. No organized abdominopelvic fluid collection. No pneumoperitoneum. Small fat containing umbilical hernia. Musculoskeletal: No acute or significant osseous findings. Small disc bulges at L4-5 and L5-S1. IMPRESSION: 1. No acute abdominopelvic findings. Specifically, no evidence of obstructive uropathy. 2. Moderate volume stool throughout the colon. 3. Small volume free fluid within the cul-de-sac, which may be physiologic.  Electronically Signed   By: Duanne Guess D.O.   On: 12/20/2022 09:59    Procedures Procedures    Medications Ordered in ED Medications  HYDROcodone-acetaminophen (NORCO/VICODIN) 5-325 MG per tablet 1 tablet (1 tablet Oral Given 12/20/22 0930)  ondansetron (ZOFRAN-ODT) disintegrating tablet 8 mg (8 mg Oral Given 12/20/22 0930)    ED Course/ Medical Decision Making/ A&P                             Medical Decision Making Risk Prescription drug management.   37 yo F with a chief complaint of right-sided abdominal pain that radiates to the back.  This has been going on for about 3 days.  Atraumatic.  By history it sounded more like musculoskeletal back pain but she tells me it starts in the abdomen and goes into the back which would be atypical.  She is uncomfortable on her abdominal exam.  She had  a CT scan performed that showed no nephrolithiasis on my independent interpretation, appendix negative per radiology.  UA negative for infection.  No significant electrolyte abnormality LFTs and lipase are unremarkable.  I discussed results with the patient.  Will discharge home.  PCP follow-up.   11:58 AM:  I have discussed the diagnosis/risks/treatment options with the patient and family.  Evaluation and diagnostic testing in the emergency department does not suggest an emergent condition requiring admission or immediate intervention beyond what has been performed at this time.  They will follow up with PCP. We also discussed returning to the ED immediately if new or worsening sx occur. We discussed the sx which are most concerning (e.g., sudden worsening pain, fever, inability to tolerate by mouth) that necessitate immediate return. Medications administered to the patient during their visit and any new prescriptions provided to the patient are listed below.  Medications given during this visit Medications  HYDROcodone-acetaminophen (NORCO/VICODIN) 5-325 MG per tablet 1 tablet (1 tablet Oral  Given 12/20/22 0930)  ondansetron (ZOFRAN-ODT) disintegrating tablet 8 mg (8 mg Oral Given 12/20/22 0930)     The patient appears reasonably screen and/or stabilized for discharge and I doubt any other medical condition or other [facility] requiring further screening, evaluation, or treatment in the ED at this time prior to discharge.          Final Clinical Impression(s) / ED Diagnoses Final diagnoses:  Right sided abdominal pain    Rx / DC Orders ED Discharge Orders          Ordered    ondansetron (ZOFRAN-ODT) 4 MG disintegrating tablet        12/20/22 1145              Melene Plan, DO 12/20/22 1158

## 2022-12-20 NOTE — Discharge Instructions (Addendum)
Take 4 over the counter ibuprofen tablets 3 times a day or 2 over-the-counter naproxen tablets twice a day for pain. Also take tylenol (2 extra strength) four times a day.   There was no obvious finding for your abdominal pain.  Please follow up with your family doc.  Return for worsening pain, fever, inability to eat or drink.

## 2022-12-30 ENCOUNTER — Emergency Department (HOSPITAL_COMMUNITY)
Admission: EM | Admit: 2022-12-30 | Discharge: 2022-12-31 | Disposition: A | Discharge status: Home / Self Care | Payer: Self-pay | Attending: Emergency Medicine | Admitting: Emergency Medicine

## 2022-12-30 ENCOUNTER — Encounter (HOSPITAL_COMMUNITY): Payer: Self-pay | Admitting: Emergency Medicine

## 2022-12-30 DIAGNOSIS — M5441 Lumbago with sciatica, right side: Secondary | ICD-10-CM

## 2022-12-30 DIAGNOSIS — M545 Low back pain, unspecified: Secondary | ICD-10-CM | POA: Insufficient documentation

## 2022-12-30 NOTE — ED Triage Notes (Addendum)
Pt reports flank and RLQ pain 3 days ago. She was seen on 7th for this and states feels same but pain not getting better. Takes OTC meds for pain. Pain is on the right side only radiating to leg. Denies bladder or bowel incontinence.

## 2022-12-31 ENCOUNTER — Emergency Department (HOSPITAL_COMMUNITY): Payer: Self-pay

## 2022-12-31 ENCOUNTER — Other Ambulatory Visit: Payer: Self-pay

## 2022-12-31 LAB — COMPREHENSIVE METABOLIC PANEL
ALT: 35 U/L (ref 0–44)
AST: 38 U/L (ref 15–41)
Albumin: 3.4 g/dL — ABNORMAL LOW (ref 3.5–5.0)
Alkaline Phosphatase: 69 U/L (ref 38–126)
Anion gap: 12 (ref 5–15)
BUN: 10 mg/dL (ref 6–20)
CO2: 25 mmol/L (ref 22–32)
Calcium: 9 mg/dL (ref 8.9–10.3)
Chloride: 101 mmol/L (ref 98–111)
Creatinine, Ser: 0.79 mg/dL (ref 0.44–1.00)
GFR, Estimated: >60 mL/min (ref >60)
Glucose, Bld: 110 mg/dL — ABNORMAL HIGH (ref 70–99)
Potassium: 3.3 mmol/L — ABNORMAL LOW (ref 3.5–5.1)
Sodium: 138 mmol/L (ref 135–145)
Total Bilirubin: 0.5 mg/dL (ref 0.3–1.2)
Total Protein: 6.2 g/dL — ABNORMAL LOW (ref 6.5–8.1)

## 2022-12-31 LAB — URINALYSIS, ROUTINE W REFLEX MICROSCOPIC
Bilirubin Urine: NEGATIVE
Glucose, UA: NEGATIVE mg/dL
Hgb urine dipstick: NEGATIVE
Ketones, ur: NEGATIVE mg/dL
Leukocytes,Ua: NEGATIVE
Nitrite: NEGATIVE
Protein, ur: 30 mg/dL — AB
Specific Gravity, Urine: 1.031 — ABNORMAL HIGH (ref 1.005–1.030)
pH: 5 (ref 5.0–8.0)

## 2022-12-31 LAB — CBC WITH DIFFERENTIAL/PLATELET
Abs Immature Granulocytes: 0.04 10*3/uL (ref 0.00–0.07)
Basophils Absolute: 0.1 10*3/uL (ref 0.0–0.1)
Basophils Relative: 1 %
Eosinophils Absolute: 0.2 10*3/uL (ref 0.0–0.5)
Eosinophils Relative: 2 %
HCT: 34.6 % — ABNORMAL LOW (ref 36.0–46.0)
Hemoglobin: 11.9 g/dL — ABNORMAL LOW (ref 12.0–15.0)
Immature Granulocytes: 1 %
Lymphocytes Relative: 55 %
Lymphs Abs: 4.7 10*3/uL — ABNORMAL HIGH (ref 0.7–4.0)
MCH: 29.9 pg (ref 26.0–34.0)
MCHC: 34.4 g/dL (ref 30.0–36.0)
MCV: 86.9 fL (ref 80.0–100.0)
Monocytes Absolute: 0.6 10*3/uL (ref 0.1–1.0)
Monocytes Relative: 7 %
Neutro Abs: 2.9 10*3/uL (ref 1.7–7.7)
Neutrophils Relative %: 34 %
Platelets: 246 10*3/uL (ref 150–400)
RBC: 3.98 MIL/uL (ref 3.87–5.11)
RDW: 12.7 % (ref 11.5–15.5)
WBC: 8.4 10*3/uL (ref 4.0–10.5)
nRBC: 0 % (ref 0.0–0.2)

## 2022-12-31 LAB — POC URINE PREG, ED: Preg Test, Ur: NEGATIVE

## 2022-12-31 LAB — LIPASE, BLOOD: Lipase: 34 U/L (ref 11–51)

## 2022-12-31 MED ORDER — CYCLOBENZAPRINE HCL 10 MG PO TABS: 10.0000 mg | ORAL_TABLET | Freq: Two times a day (BID) | ORAL | 0 refills | Status: DC | PRN

## 2022-12-31 MED ORDER — IOHEXOL 350 MG/ML SOLN
75.0000 mL | Freq: Once | INTRAVENOUS | Status: AC | PRN
  Administered 2022-12-31: 75 mL via INTRAVENOUS

## 2022-12-31 MED ORDER — PREDNISONE 10 MG PO TABS: 30.0000 mg | ORAL_TABLET | Freq: Every day | ORAL | 0 refills | Status: AC

## 2022-12-31 NOTE — ED Notes (Signed)
Pt wandered back into triage acting confused. RN redirected her back to waiting room. Her partner wants to know how much longer- states they only need work note. RN discussed that earlier she wanted to be seen for her back pain. Pt and partner appear to be under the influence.

## 2022-12-31 NOTE — Discharge Instructions (Signed)
You have been seen here for back pain. I recommend taking over-the-counter pain medications like ibuprofen and/or Tylenol every 6 as needed.  Please follow dosage and on the back of bottle.  I also recommend applying heat to the area and stretching out the muscles as this will help decrease stiffness and pain.  I have given you information on exercises please follow.   I have also given you a prescription for a muscle relaxer this can make you drowsy do not consume alcohol or operate heavy machinery when taking this medication.   Please follow-up with community health and wellness as well as neurosurgery for further evaluation.  Come back to the emergency department if you develop chest pain, shortness of breath, severe abdominal pain, uncontrolled nausea, vomiting, diarrhea.

## 2022-12-31 NOTE — ED Provider Notes (Signed)
[place] EMERGENCY DEPARTMENT AT [facility] Provider Note   CSN: [id number] Arrival date & time: 12/30/22  2322     History  Chief Complaint  Patient presents with   Flank Pain    Gwendolyn Hampton is a 37 y.o. female.  HPI   Patient without significant medical history presenting with complaints of right lower back pain, started about 1 week ago, pain is in her right buttocks and radiate down her right leg, no saddle paresthesias no urinary or bowel incontinence sees, denies any trauma to the area.  She does endorse some urinary frequency with slight dysuria but no hematuria, she states she has some slight right lower quadrant pain, no associated nausea vomiting, she denies any fevers chills cough congestion general body aches.  States that she has been taking home medication without much relief.  Patient has had hysterectomy as well as a cholecystectomy.  Reviewed patient's chart was seen on the 17th for similar presentation, had a benign workup including CT renal.  She was discharged home.  Home Medications Prior to Admission medications   Medication Sig Start Date End Date Taking? Authorizing Provider  cyclobenzaprine (FLEXERIL) 10 MG tablet Take 1 tablet (10 mg total) by mouth 2 (two) times daily as needed for muscle spasms. 12/31/22  Yes Carroll Sage, PA-C  predniSONE (DELTASONE) 10 MG tablet Take 3 tablets (30 mg total) by mouth daily for 5 days. 12/31/22 01/05/23 Yes Carroll Sage, PA-C  Acetaminophen 500 MG capsule Take 2 capsules (1,000 mg total) by mouth every 6 (six) hours as needed. 10/18/22   Fayrene Helper, PA-C  dicyclomine (BENTYL) 20 MG tablet Take 1 tablet (20 mg total) by mouth 2 (two) times daily. 03/19/19   Caccavale, Sophia, PA-C  HYDROcodone-acetaminophen (NORCO/VICODIN) 5-325 MG tablet Take 1-2 tablets by mouth every 6 (six) hours as needed. 05/26/19   Benjiman Core, MD  ondansetron (ZOFRAN-ODT) 4 MG disintegrating tablet 4mg  ODT q4 hours prn  nausea/vomit 12/20/22   Melene Plan, DO      Allergies    Bee venom, Ibuprofen, Klonopin [clonazepam], Morphine and related, Sulfa antibiotics, and Tramadol    Review of Systems   Review of Systems  Constitutional:  Negative for chills and fever.  Respiratory:  Negative for shortness of breath.   Cardiovascular:  Negative for chest pain.  Gastrointestinal:  Positive for abdominal pain. Negative for nausea and vomiting.  Genitourinary:  Positive for dysuria.  Neurological:  Negative for headaches.    Physical Exam Updated Vital Signs BP 119/65   Pulse 85   Temp 97.6 F (36.4 C) (Oral)   Resp 18   SpO2 100%  Physical Exam Vitals and nursing note reviewed.  Constitutional:      General: She is not in acute distress.    Appearance: She is not ill-appearing.  HENT:     Head: Normocephalic and atraumatic.     Nose: No congestion.  Eyes:     Conjunctiva/sclera: Conjunctivae normal.  Cardiovascular:     Rate and Rhythm: Normal rate and regular rhythm.     Pulses: Normal pulses.     Heart sounds: No murmur heard.    No friction rub. No gallop.  Pulmonary:     Effort: No respiratory distress.     Breath sounds: No wheezing, rhonchi or rales.  Abdominal:     Palpations: Abdomen is soft.     Tenderness: There is abdominal tenderness. There is no right CVA tenderness or left CVA tenderness.  Comments: Abdomen nondistended, soft, minimal tenderness noted in the right lower quadrant/right flank no CVA tenderness.  No rebound tenderness or peritoneal sign.  Musculoskeletal:     Comments: Spine was palpated was nontender to palpation no step-offs or deformities noted, patient had pain which is focalized reproducible on the right buttocks, worsened with movement, she has 5 out of 5 strength neurovascular intact in lower extremities 2+ dorsal pedal pulses, able to ambulate without difficulty.  Skin:    General: Skin is warm and dry.  Neurological:     Mental Status: She is alert.   Psychiatric:        Mood and Affect: Mood normal.     ED Results / Procedures / Treatments   Labs (all labs ordered are listed, but only abnormal results are displayed) Labs Reviewed  COMPREHENSIVE METABOLIC PANEL - Abnormal; Notable for the following components:      Result Value   Potassium 3.3 (*)    Glucose, Bld 110 (*)    Total Protein 6.2 (*)    Albumin 3.4 (*)    All other components within normal limits  CBC WITH DIFFERENTIAL/PLATELET - Abnormal; Notable for the following components:   Hemoglobin 11.9 (*)    HCT 34.6 (*)    Lymphs Abs 4.7 (*)    All other components within normal limits  URINALYSIS, ROUTINE W REFLEX MICROSCOPIC - Abnormal; Notable for the following components:   APPearance TURBID (*)    Specific Gravity, Urine 1.031 (*)    Protein, ur 30 (*)    Bacteria, UA RARE (*)    All other components within normal limits  LIPASE, BLOOD  POC URINE PREG, ED    EKG None  Radiology CT ABDOMEN PELVIS W CONTRAST  Result Date: 12/31/2022 CLINICAL DATA:  Right lower quadrant pain EXAM: CT ABDOMEN AND PELVIS WITH CONTRAST TECHNIQUE: Multidetector CT imaging of the abdomen and pelvis was performed using the standard protocol following bolus administration of intravenous contrast. RADIATION DOSE REDUCTION: This exam was performed according to the departmental dose-optimization program which includes automated exposure control, adjustment of the mA and/or kV according to patient size and/or use of iterative reconstruction technique. CONTRAST:  75mL OMNIPAQUE IOHEXOL 350 MG/ML SOLN COMPARISON:  CT 12/20/2022, 03/19/2019 FINDINGS: Lower chest: Lung bases are clear. Hepatobiliary: 1 cm hypodensity within the posterior right hepatic lobe series 3, image 17, stable since 2020 and therefore felt benign, possibly a small hemangioma. Cholecystectomy. No biliary dilatation Pancreas: Unremarkable. No pancreatic ductal dilatation or surrounding inflammatory changes. Spleen: Normal in  size without focal abnormality. Adrenals/Urinary Tract: Adrenal glands are unremarkable. Kidneys are normal, without renal calculi, focal lesion, or hydronephrosis. Bladder is unremarkable. Stomach/Bowel: Stomach is within normal limits. Appendix appears normal. No evidence of bowel wall thickening, distention, or inflammatory changes. Vascular/Lymphatic: No significant vascular findings are present. No enlarged abdominal or pelvic lymph nodes. Reproductive: Status post hysterectomy. No adnexal masses. Other: No abdominal wall hernia or abnormality. No abdominopelvic ascites. Small fat containing umbilical and periumbilical hernia. Musculoskeletal: No acute or significant osseous findings. IMPRESSION: No CT evidence for acute intra-abdominal or pelvic abnormality. Negative for acute appendicitis. Electronically Signed   By: Jasmine Pang M.D.   On: 12/31/2022 03:58    Procedures Procedures    Medications Ordered in ED Medications  iohexol (OMNIPAQUE) 350 MG/ML injection 75 mL (75 mLs Intravenous Contrast Given 12/31/22 0347)    ED Course/ Medical Decision Making/ A&P  Medical Decision Making Amount and/or Complexity of Data Reviewed Labs: ordered. Radiology: ordered.  Risk Prescription drug management.   This patient presents to the ED for concern of back pain, this involves an extensive number of treatment options, and is a complaint that carries with it a high risk of complications and morbidity.  The differential diagnosis includes dissection, UTI, pyelo-, kidney stone, diverticulitis, appendicitis    Additional history obtained:  Additional history obtained from partner at bedside External records from outside source obtained and reviewed including recent ER note   Co morbidities that complicate the patient evaluation  N/a  Social Determinants of Health:  No pcp    Lab Tests:  I Ordered, and personally interpreted labs.  The pertinent  results include: CBC shows normocytic anemia hemoglobin 11.9, CMP reveals potassium 3.3, glucose 100, lipase 34, UA unremarkable urine pregnancy negative   Imaging Studies ordered:  I ordered imaging studies including CT abdomen pelvis I independently visualized and interpreted imaging which showed negative for acute findings I agree with the radiologist interpretation   Cardiac Monitoring:  The patient was maintained on a cardiac monitor.  I personally viewed and interpreted the cardiac monitored which showed an underlying rhythm of: N/A   Medicines ordered and prescription drug management:  I ordered medication including n/a I have reviewed the patients home medicines and have made adjustments as needed  Critical Interventions:  N/a   Reevaluation:  Presents with back pain, triage obtain basic imaging which I personally reviewed was unremarkable, on my exam she has reproducible pain in her right buttocks consistent with sciatica but she also has right flank and right lower quadrant pain, she endorses dysuria, concern for possible kidney stone versus appendicitis will obtain CT imaging for further evaluation  CT imaging is negative, patient is agreement with discharge at this time.  Consultations Obtained:  N/a   Test Considered:  N/a    Rule out Doubt ectopic pregnancy as urine pregnancy is negative.  I doubt ovarian torsion she has no adnexal mass presentation atypical etiology.  I doubt UTI Pilo or kidney stone as UA is negative for signs of infection or hematuria.  I doubt volvulus, bowel obstruction, appendicitis, diverticulitis, AAA, intra-abdominal abscess and CT imaging is all negative these findings.I have low suspicion for spinal fracture or spinal cord abnormality as patient denies urinary incontinency, retention, difficulty with bowel movements, denies saddle paresthesias.  Spine was palpated there is no step-off, crepitus or gross deformities felt, patient had  5/5 strength, full range of motion, neurovascular fully intact in the lower extremities. Low suspicion for septic arthritis as patient denies IV drug use, skin exam was performed no erythematous, edema or warm joints noted.     Dispostion and problem list  After consideration of the diagnostic results and the patients response to treatment, I feel that the patent would benefit from discharge.  Back pain-likely sciatica, will provide her with muscle relaxers, steroids, refer her to primary care doctor as well as neurosurgery for further evaluation and strict return precautions.            Final Clinical Impression(s) / ED Diagnoses Final diagnoses:  Acute right-sided low back pain with right-sided sciatica    Rx / DC Orders ED Discharge Orders          Ordered    predniSONE (DELTASONE) 10 MG tablet  Daily        12/31/22 0458    cyclobenzaprine (FLEXERIL) 10 MG tablet  2 times daily PRN  12/31/22 0458              Carroll Sage, PA-C 12/31/22 0500    Sabas Sous, MD 12/31/22 [phone]

## 2023-01-07 ENCOUNTER — Other Ambulatory Visit: Payer: Self-pay

## 2023-01-07 DIAGNOSIS — S0990XA Unspecified injury of head, initial encounter: Secondary | ICD-10-CM | POA: Insufficient documentation

## 2023-01-07 DIAGNOSIS — Z716 Tobacco abuse counseling: Secondary | ICD-10-CM | POA: Insufficient documentation
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
## 2023-01-08 ENCOUNTER — Emergency Department
Admission: EM | Admit: 2023-01-08 | Discharge: 2023-01-08 | Disposition: A | Discharge status: Home / Self Care | Payer: Self-pay | Attending: Emergency Medicine | Admitting: Emergency Medicine

## 2023-01-08 ENCOUNTER — Emergency Department: Payer: Self-pay

## 2023-01-08 DIAGNOSIS — Z716 Tobacco abuse counseling: Secondary | ICD-10-CM

## 2023-01-08 DIAGNOSIS — S0990XA Unspecified injury of head, initial encounter: Secondary | ICD-10-CM

## 2023-01-08 HISTORY — DX: Bipolar disorder, unspecified: F31.9

## 2023-01-08 HISTORY — DX: Attention-deficit hyperactivity disorder, unspecified type: F90.9

## 2023-01-08 MED ORDER — NICOTINE POLACRILEX 4 MG MT LOZG: 4.0000 mg | LOZENGE | OROMUCOSAL | 0 refills | Status: DC | PRN

## 2023-01-08 MED ORDER — NICOTINE 7 MG/24HR TD PT24: 7.0000 mg | MEDICATED_PATCH | Freq: Every day | TRANSDERMAL | 3 refills | Status: DC

## 2023-01-08 MED ORDER — ACETAMINOPHEN 500 MG PO TABS
1000.0000 mg | ORAL_TABLET | Freq: Once | ORAL | Status: AC
  Administered 2023-01-08: 1000 mg via ORAL
  Filled 2023-01-08: qty 2
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
## 2023-01-08 NOTE — Discharge Instructions (Signed)
Take acetaminophen 650 mg and ibuprofen 400 mg every 6 hours for pain.  Take with food.  

## 2023-02-09 ENCOUNTER — Other Ambulatory Visit: Payer: Self-pay

## 2023-02-09 ENCOUNTER — Encounter (HOSPITAL_BASED_OUTPATIENT_CLINIC_OR_DEPARTMENT_OTHER): Payer: Self-pay

## 2023-02-09 DIAGNOSIS — M545 Low back pain, unspecified: Secondary | ICD-10-CM | POA: Insufficient documentation

## 2023-02-09 DIAGNOSIS — Y92481 Parking lot as the place of occurrence of the external cause: Secondary | ICD-10-CM | POA: Insufficient documentation
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
## 2023-02-10 ENCOUNTER — Emergency Department (HOSPITAL_BASED_OUTPATIENT_CLINIC_OR_DEPARTMENT_OTHER)
Admission: EM | Admit: 2023-02-10 | Discharge: 2023-02-10 | Disposition: A | Discharge status: Home / Self Care | Payer: 59 | Attending: Emergency Medicine | Admitting: Emergency Medicine

## 2023-02-10 ENCOUNTER — Emergency Department (HOSPITAL_BASED_OUTPATIENT_CLINIC_OR_DEPARTMENT_OTHER): Payer: 59

## 2023-02-10 DIAGNOSIS — S39012A Strain of muscle, fascia and tendon of lower back, initial encounter: Secondary | ICD-10-CM

## 2023-02-10 DIAGNOSIS — M545 Low back pain, unspecified: Secondary | ICD-10-CM | POA: Diagnosis not present

## 2023-02-10 DIAGNOSIS — S29019A Strain of muscle and tendon of unspecified wall of thorax, initial encounter: Secondary | ICD-10-CM

## 2023-02-10 NOTE — Discharge Instructions (Signed)
Take ibuprofen 600 mg every 6 hours as needed for pain.  Rest.  Follow-up with primary doctor if not improving in the next week. 
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
## 2023-04-25 IMAGING — DX DG SHOULDER 2+V*L*
3 series · 3 of 3 positions shown · non-contrast
Comparison: None.

CLINICAL DATA: Pain after lifting boxes.

EXAM:
LEFT SHOULDER - 2+ VIEW

[shoulder grashey]
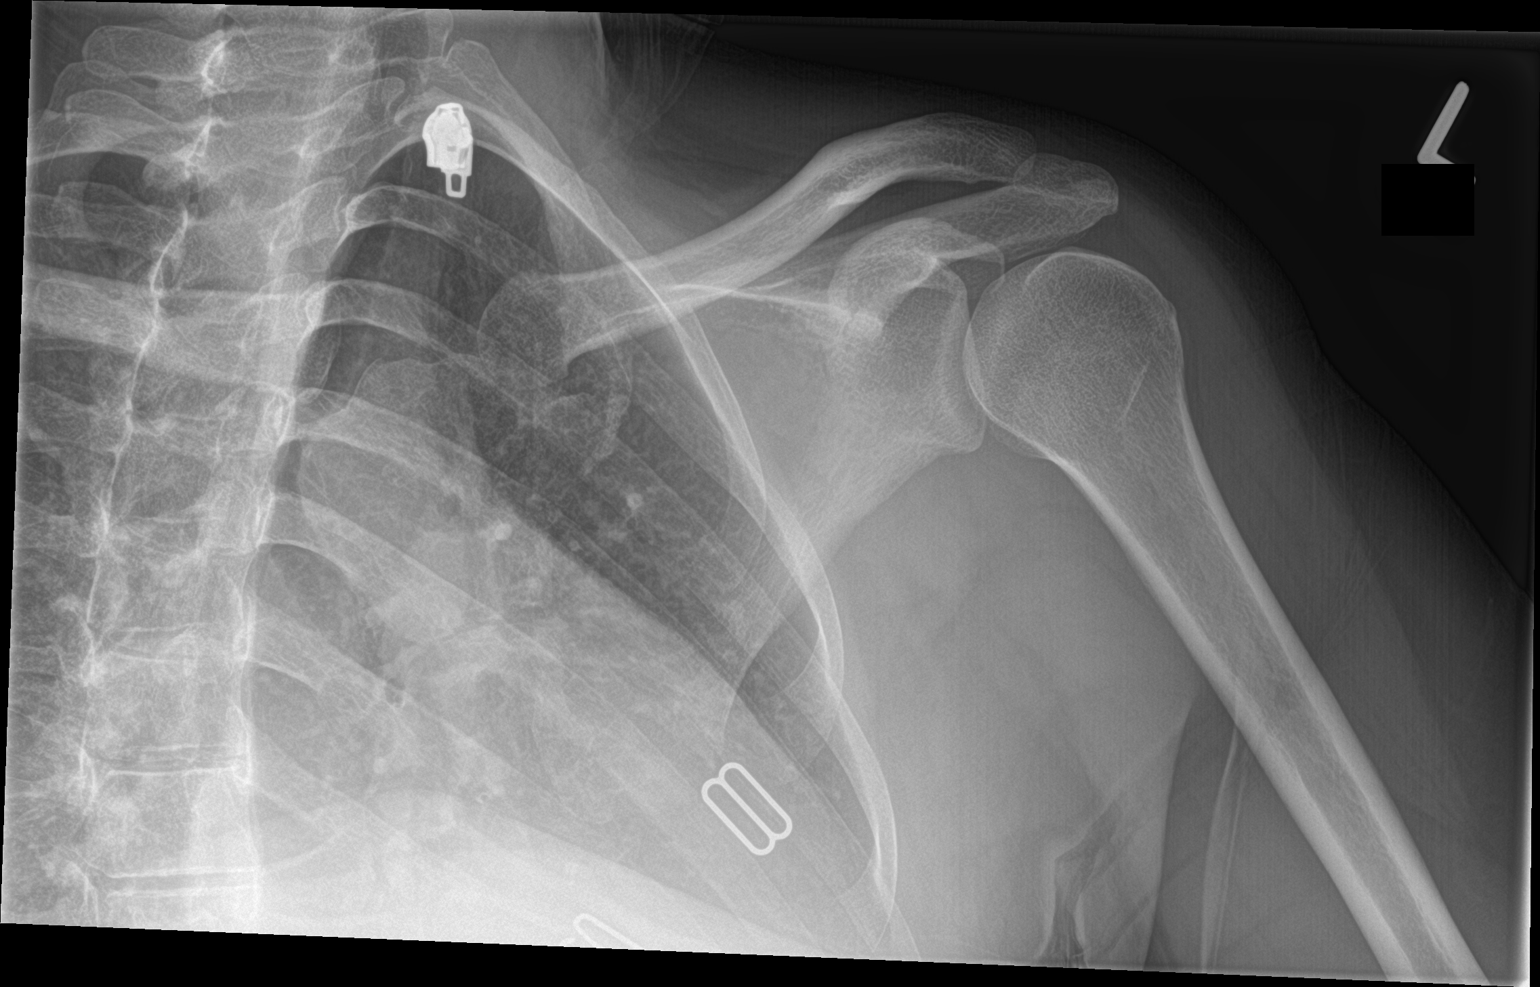

[shoulder y view]
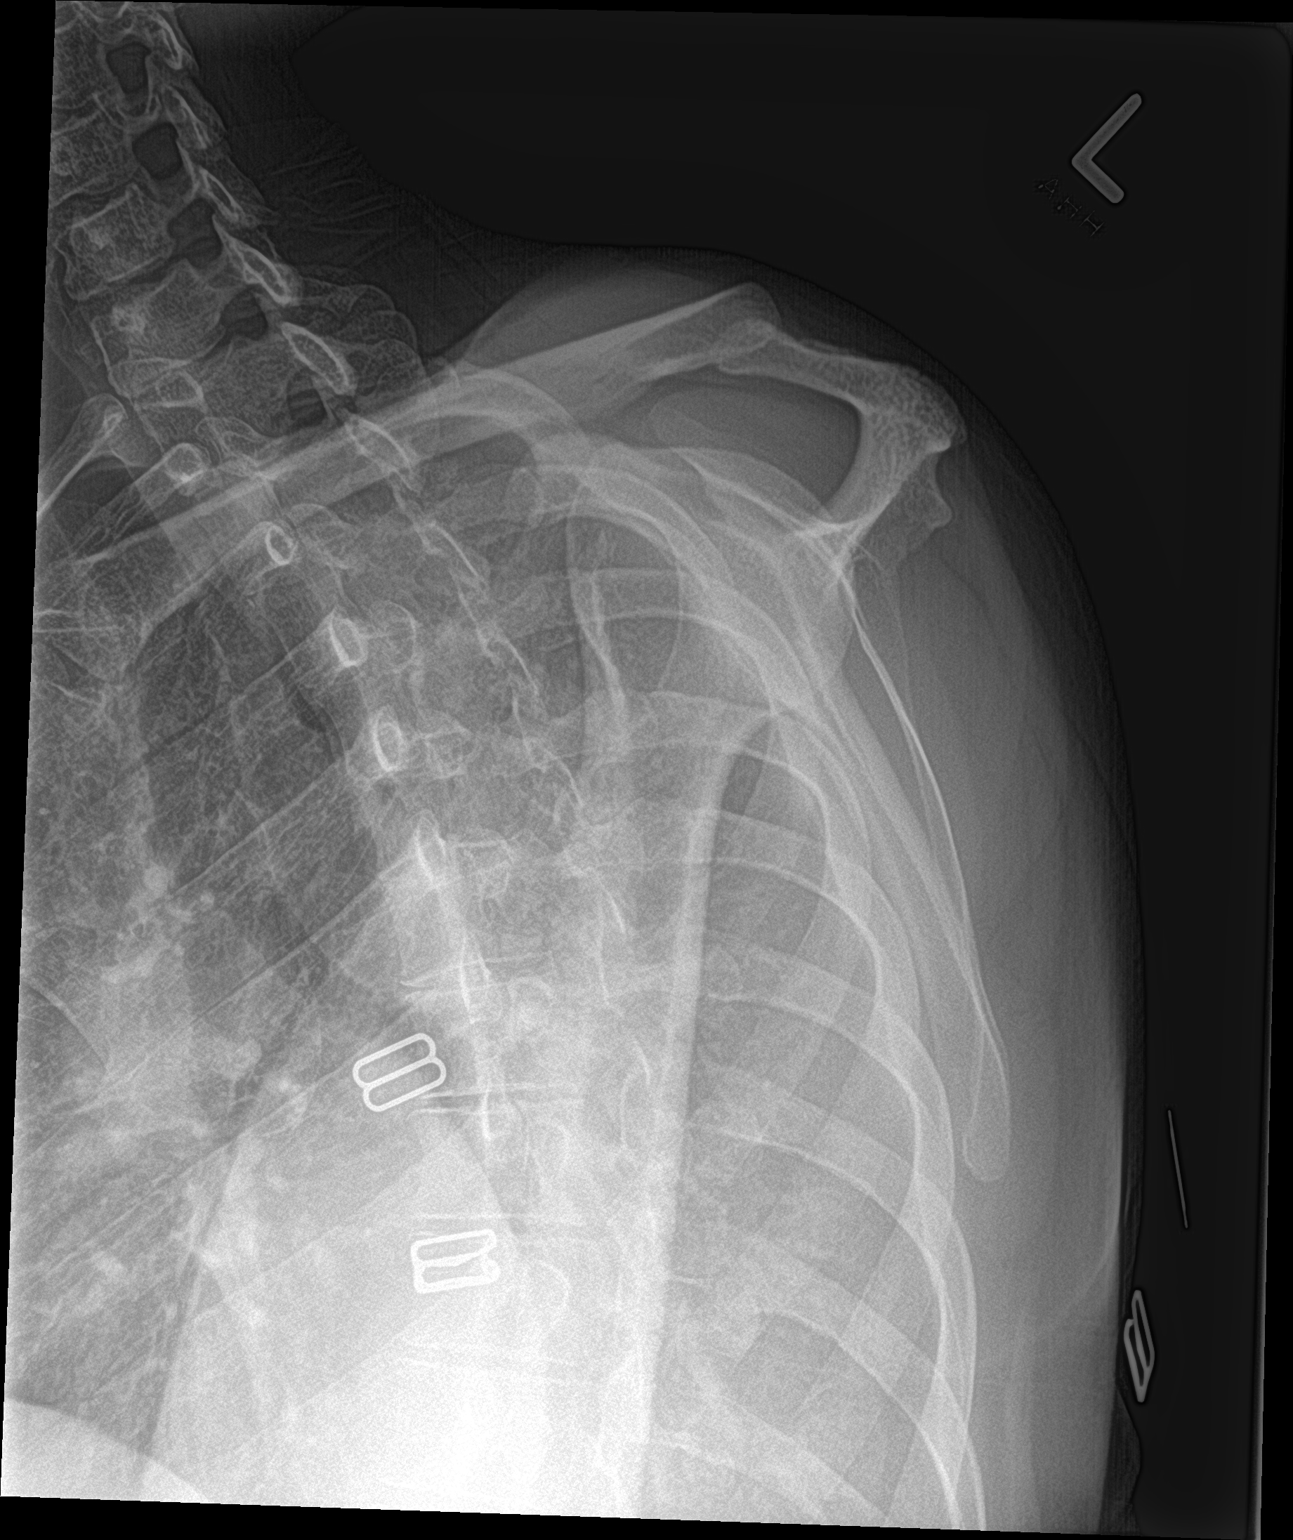

[shoulder axillary]
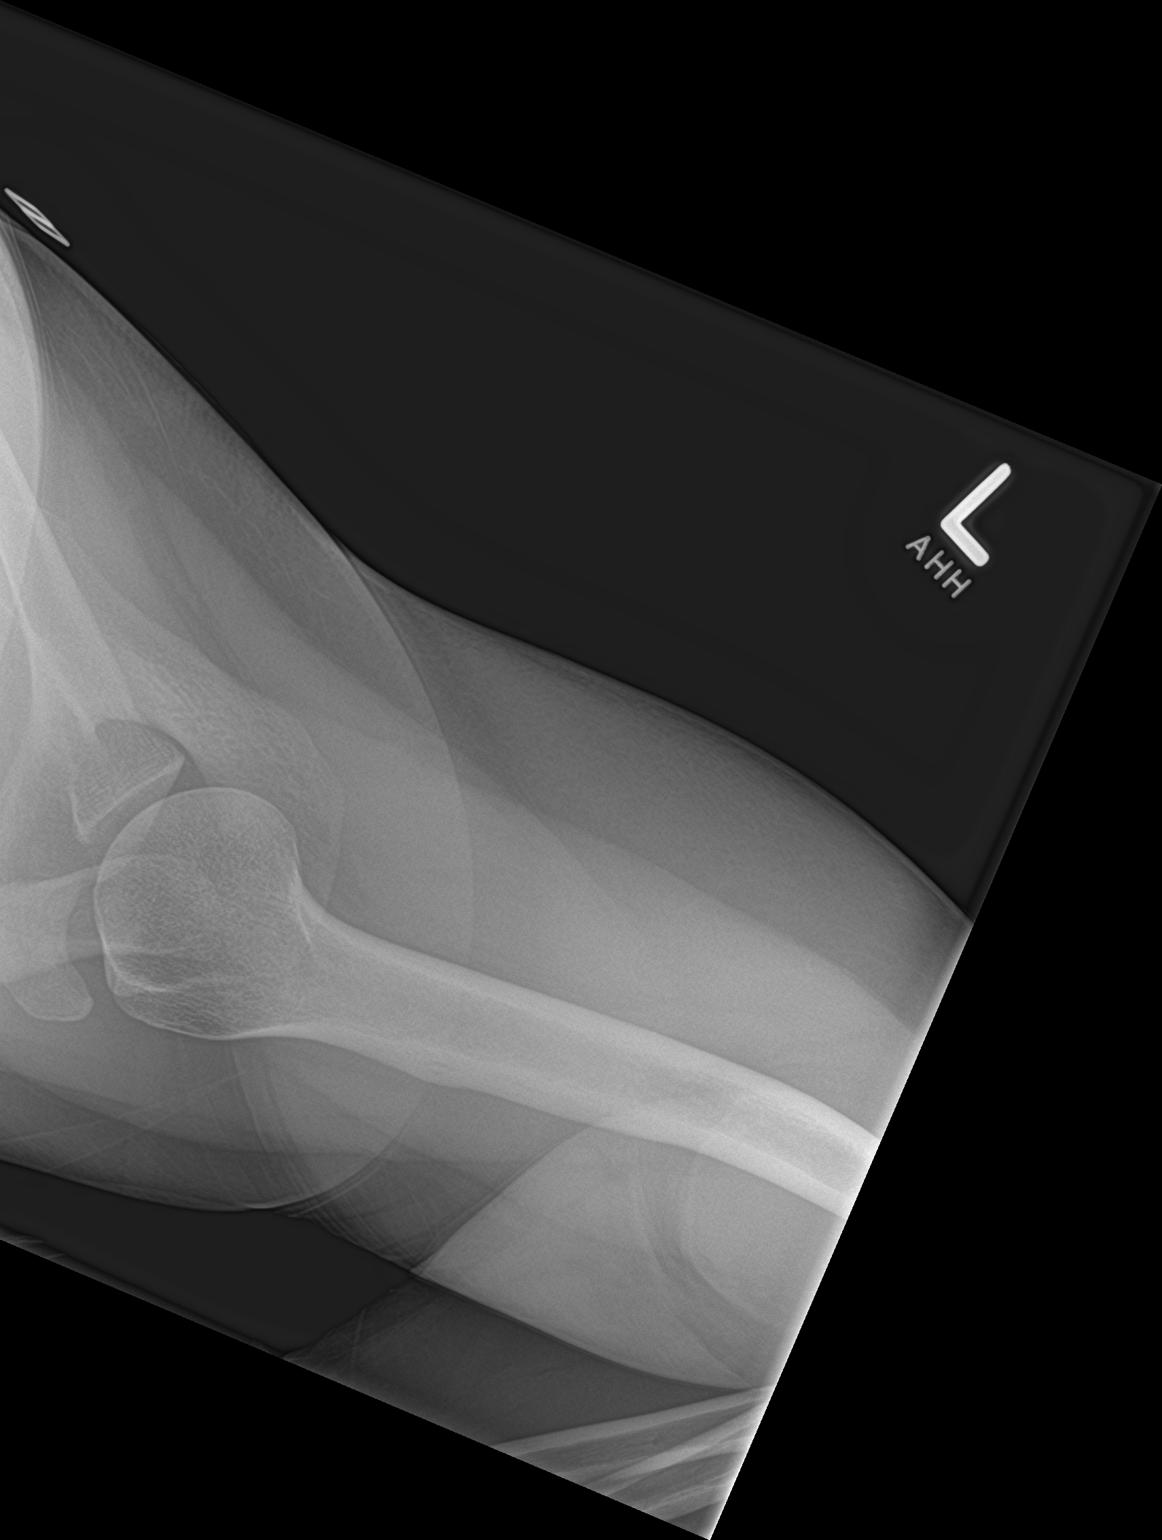

[3 of 3 positions shown; findings below may reference images not displayed]

FINDINGS: There is no evidence of fracture or dislocation. There is no
evidence of arthropathy or other focal bone abnormality. Soft
tissues are unremarkable.
IMPRESSION: Negative.

## 2023-05-14 ENCOUNTER — Emergency Department (HOSPITAL_COMMUNITY)
Admission: EM | Admit: 2023-05-14 | Discharge: 2023-05-15 | Disposition: A | Discharge status: Home / Self Care | Payer: 59 | Attending: Emergency Medicine | Admitting: Emergency Medicine

## 2023-05-14 DIAGNOSIS — K6289 Other specified diseases of anus and rectum: Secondary | ICD-10-CM | POA: Diagnosis present

## 2023-05-14 DIAGNOSIS — K623 Rectal prolapse: Secondary | ICD-10-CM | POA: Diagnosis not present

## 2023-05-14 MED ORDER — OXYCODONE HCL 5 MG PO TABS
10.0000 mg | ORAL_TABLET | Freq: Once | ORAL | Status: AC
  Administered 2023-05-14: 10 mg via ORAL
  Filled 2023-05-14: qty 2

## 2023-05-14 NOTE — ED Provider Triage Note (Signed)
Emergency Medicine Provider Triage Evaluation Note  Gwendolyn Hampton , a 37 y.o. female  was evaluated in triage.  Pt complains of rectal prolapse. Patient admits to hx of rectal prolapse with resolution after placing sugar on it in the ED. First began in March 2024.  States admits to repeat rectal prolapse that began yesterday. No improvement after sugar water at home. Admits to bright red blood per rectum. Admits to generalized abdominal pain, nausea without vomiting.   Review of Systems  Positive: Please see above. Negative:   Physical Exam  SpO2 96%  Gen:   Awake, no distress   Resp:  Normal effort  MSK:   Moves extremities without difficulty  Other:  Rectal exam demonstrates prolapse. No ischemia or dusky bowel. No blood on exam.   Medical Decision Making  Medically screening exam initiated at 11:31 PM.  Appropriate orders placed.  Marsa Valin was informed that the remainder of the evaluation will be completed by another provider, this initial triage assessment does not replace that evaluation, and the importance of remaining in the ED until their evaluation is complete.  Pain meds given. Eval when room ready.     Franne Forts, DO 05/14/23 2336

## 2023-05-14 NOTE — ED Triage Notes (Signed)
Patient BIB [facility] from home for abdominal pain, tarry and bright red stool off and on since march and starting again 2 days ago. Patient called tonight d/t increasing pain. A&Ox4, able to stand and turn, reports hx of prolapsed rectum but no symptoms of same at this time.

## 2023-05-15 ENCOUNTER — Telehealth: Payer: Self-pay

## 2023-05-15 LAB — BASIC METABOLIC PANEL
Anion gap: 9 (ref 5–15)
BUN: 14 mg/dL (ref 6–20)
CO2: 27 mmol/L (ref 22–32)
Calcium: 8.7 mg/dL — ABNORMAL LOW (ref 8.9–10.3)
Chloride: 101 mmol/L (ref 98–111)
Creatinine, Ser: 0.89 mg/dL (ref 0.44–1.00)
GFR, Estimated: >60 mL/min (ref >60)
Glucose, Bld: 92 mg/dL (ref 70–99)
Potassium: 3.9 mmol/L (ref 3.5–5.1)
Sodium: 137 mmol/L (ref 135–145)

## 2023-05-15 LAB — CBC WITH DIFFERENTIAL/PLATELET
Abs Immature Granulocytes: 0.06 10*3/uL (ref 0.00–0.07)
Basophils Absolute: 0.1 10*3/uL (ref 0.0–0.1)
Basophils Relative: 1 %
Eosinophils Absolute: 0.2 10*3/uL (ref 0.0–0.5)
Eosinophils Relative: 2 %
HCT: 36.3 % (ref 36.0–46.0)
Hemoglobin: 11.9 g/dL — ABNORMAL LOW (ref 12.0–15.0)
Immature Granulocytes: 1 %
Lymphocytes Relative: 41 %
Lymphs Abs: 3.5 10*3/uL (ref 0.7–4.0)
MCH: 28.9 pg (ref 26.0–34.0)
MCHC: 32.8 g/dL (ref 30.0–36.0)
MCV: 88.1 fL (ref 80.0–100.0)
Monocytes Absolute: 0.7 10*3/uL (ref 0.1–1.0)
Monocytes Relative: 8 %
Neutro Abs: 4.1 10*3/uL (ref 1.7–7.7)
Neutrophils Relative %: 47 %
Platelets: 216 10*3/uL (ref 150–400)
RBC: 4.12 MIL/uL (ref 3.87–5.11)
RDW: 12.3 % (ref 11.5–15.5)
WBC: 8.7 10*3/uL (ref 4.0–10.5)
nRBC: 0 % (ref 0.0–0.2)

## 2023-05-15 MED ORDER — DOCUSATE SODIUM 100 MG PO CAPS: 100.0000 mg | ORAL_CAPSULE | Freq: Two times a day (BID) | ORAL | 0 refills | Status: DC

## 2023-05-15 MED ORDER — ONDANSETRON HCL 4 MG/2ML IJ SOLN
4.0000 mg | Freq: Once | INTRAMUSCULAR | Status: AC
  Administered 2023-05-15: 4 mg via INTRAVENOUS
  Filled 2023-05-15: qty 2

## 2023-05-15 MED ORDER — LIDOCAINE HCL 2 % EX GEL: 1.0000 | CUTANEOUS | 0 refills | Status: DC | PRN

## 2023-05-15 MED ORDER — LIDOCAINE HCL URETHRAL/MUCOSAL 2 % EX GEL
1.0000 | Freq: Once | CUTANEOUS | Status: AC
  Administered 2023-05-15: 1 via TOPICAL
  Filled 2023-05-15: qty 11

## 2023-05-15 MED ORDER — FENTANYL CITRATE PF 50 MCG/ML IJ SOSY
100.0000 ug | PREFILLED_SYRINGE | Freq: Once | INTRAMUSCULAR | Status: AC
  Administered 2023-05-15: 100 ug via INTRAVENOUS
  Filled 2023-05-15: qty 2

## 2023-05-15 NOTE — Discharge Instructions (Addendum)
You were seen today for ongoing issues with rectal prolapse.  It is very important that you follow-up with general surgery.  This will likely not resolve on its own given chronicity of your symptoms.  Make sure that you are drinking plenty of fluids and taking a stool softeners to keep stools soft.  You may apply lidocaine jelly as needed for comfort.

## 2023-05-15 NOTE — Telephone Encounter (Signed)
This RNCM received call from patient indicating the pharmacy did not have her medications. This RNCM spoke with [facility] on [address] [phone] who reports [facility] does not accept her insurance. [facility] does have the lidocaine 2% jelly in stock at oop cost of $12.69. This RNCM advised patient of cost as she can get Colace over the counter as well.   No additional TOC needs at this time.

## 2023-05-15 NOTE — ED Provider Notes (Signed)
EMERGENCY DEPARTMENT AT [facility] Provider Note   CSN: [id number] Arrival date & time: 05/14/23  2302     History  Chief Complaint  Patient presents with   Rectal Pain    Gwendolyn Hampton is a 37 y.o. female.  HPI     This is a 37 year old female who presents with rectal pain and bleeding.  Has a known history of rectal prolapse congenitally.  Has been seen and evaluated in the past with similar symptoms.  Patient states that it has been worse over the last day or so.  She reports bloody stools.  She has previously been seen but has never followed up with surgery.  She tried to manually reduce it without success at home including with sugar water.  Also reports generalized abdominal discomfort and nausea.  Chart reviewed.  Similar presentation in 2023 at [facility] regional.  Home Medications Prior to Admission medications   Medication Sig Start Date End Date Taking? Authorizing Provider  docusate sodium (COLACE) 100 MG capsule Take 1 capsule (100 mg total) by mouth every 12 (twelve) hours. 05/15/23  Yes Rondo Spittler, Mayer Masker, MD  lidocaine (XYLOCAINE) 2 % jelly Apply 1 Application topically as needed. 05/15/23  Yes Agustus Mane, Mayer Masker, MD  Acetaminophen 500 MG capsule Take 2 capsules (1,000 mg total) by mouth every 6 (six) hours as needed. 10/18/22   Fayrene Helper, PA-C  cyclobenzaprine (FLEXERIL) 10 MG tablet Take 1 tablet (10 mg total) by mouth 2 (two) times daily as needed for muscle spasms. 12/31/22   Carroll Sage, PA-C  dicyclomine (BENTYL) 20 MG tablet Take 1 tablet (20 mg total) by mouth 2 (two) times daily. 03/19/19   Caccavale, Sophia, PA-C  HYDROcodone-acetaminophen (NORCO/VICODIN) 5-325 MG tablet Take 1-2 tablets by mouth every 6 (six) hours as needed. 05/26/19   Benjiman Core, MD  nicotine (NICODERM CQ - DOSED IN MG/24 HR) 7 mg/24hr patch Place 1 patch (7 mg total) onto the skin daily. 01/08/23 01/08/24  Pilar Jarvis, MD  nicotine polacrilex (NICOTINE  MINI) 4 MG lozenge Take 1 lozenge (4 mg total) by mouth as needed. 01/08/23   Pilar Jarvis, MD  ondansetron (ZOFRAN-ODT) 4 MG disintegrating tablet 4mg  ODT q4 hours prn nausea/vomit 12/20/22   Melene Plan, DO      Allergies    Bee venom, Ibuprofen, Klonopin [clonazepam], Morphine and codeine, Sulfa antibiotics, and Tramadol    Review of Systems   Review of Systems  Gastrointestinal:  Positive for abdominal pain, anal bleeding, blood in stool, nausea and vomiting.  All other systems reviewed and are negative.   Physical Exam Updated Vital Signs BP 117/68   Pulse 68   Temp 97.8 F (36.6 C)   Resp 14   SpO2 99%  Physical Exam Vitals and nursing note reviewed.  Constitutional:      Appearance: She is well-developed. She is obese. She is ill-appearing.  HENT:     Head: Normocephalic and atraumatic.  Eyes:     Pupils: Pupils are equal, round, and reactive to light.  Cardiovascular:     Rate and Rhythm: Normal rate and regular rhythm.     Heart sounds: Normal heart sounds.  Pulmonary:     Effort: Pulmonary effort is normal. No respiratory distress.  Abdominal:     Palpations: Abdomen is soft.  Genitourinary:    Comments: Mildly prolapsed rectum, no active bleeding, no obvious fissure Musculoskeletal:     Cervical back: Neck supple.  Skin:    General:  Skin is warm and dry.  Neurological:     Mental Status: She is alert and oriented to person, place, and time.  Psychiatric:        Mood and Affect: Mood normal.     ED Results / Procedures / Treatments   Labs (all labs ordered are listed, but only abnormal results are displayed) Labs Reviewed  CBC WITH DIFFERENTIAL/PLATELET - Abnormal; Notable for the following components:      Result Value   Hemoglobin 11.9 (*)    All other components within normal limits  BASIC METABOLIC PANEL - Abnormal; Notable for the following components:   Calcium 8.7 (*)    All other components within normal limits    EKG None  Radiology No  results found.  Procedures Procedures    Medications Ordered in ED Medications  oxyCODONE (Oxy IR/ROXICODONE) immediate release tablet 10 mg (10 mg Oral Given 05/14/23 2339)  ondansetron (ZOFRAN) injection 4 mg (4 mg Intravenous Given 05/15/23 0540)  fentaNYL (SUBLIMAZE) injection 100 mcg (100 mcg Intravenous Given 05/15/23 0540)  lidocaine (XYLOCAINE) 2 % jelly 1 Application (1 Application Topical Given 05/15/23 0616)    ED Course/ Medical Decision Making/ A&P Clinical Course as of 05/15/23 0706  Tue May 15, 2023  0615 Prolapse self resolved prior to any intervention.  I did inject some lidocaine jelly for comfort. [CH]  1610 Patient much more comfortable on recheck. [CH]    Clinical Course User Index [CH] Drisana Schweickert, Mayer Masker, MD                                 Medical Decision Making Amount and/or Complexity of Data Reviewed Labs: ordered.  Risk OTC drugs. Prescription drug management.   This patient presents to the ED for concern of rectal prolapse, this involves an extensive number of treatment options, and is a complaint that carries with it a high risk of complications and morbidity.  I considered the following differential and admission for this acute, potentially life threatening condition.  The differential diagnosis includes rectal prolapse, constipation  MDM:    This is a 37 year old female with a known history of rectal prolapse who presents with the same.  Also reports abdominal pain.  She is nontoxic and vital signs are reassuring.  Reports some bleeding with stools.  She has obvious rectal prolapse on exam.  Prior to intervention, this self resolved.  This is a chronic problem with her and congenital in nature.  No obvious fissures.  No gross blood.  Hemoglobin is stable from prior.  Patient much more comfortable after lidocaine jelly was applied.  Discussed with her that it is imperative that she follow-up with general surgery.  Her abdominal exam is benign and I do  not feel she needs advanced imaging.  (Labs, imaging, consults)  Labs: I Ordered, and personally interpreted labs.  The pertinent results include: CBC, BMP  Imaging Studies ordered: I ordered imaging studies including N/A I independently visualized and interpreted imaging. I agree with the radiologist interpretation  Additional history obtained from chart review.  External records from outside source obtained and reviewed including outside hospital visit  Cardiac Monitoring: The patient was not maintained on a cardiac monitor.  If on the cardiac monitor, I personally viewed and interpreted the cardiac monitored which showed an underlying rhythm of: N/A Reevaluation: After the interventions noted above, I reevaluated the patient and found that they have :improved  Social Determinants  of Health:  lives independently  Disposition: Discharge  Co morbidities that complicate the patient evaluation  Past Medical History:  Diagnosis Date   ADHD    Bipolar 1 disorder (HCC)      Medicines Meds ordered this encounter  Medications   oxyCODONE (Oxy IR/ROXICODONE) immediate release tablet 10 mg   ondansetron (ZOFRAN) injection 4 mg   fentaNYL (SUBLIMAZE) injection 100 mcg   lidocaine (XYLOCAINE) 2 % jelly 1 Application   lidocaine (XYLOCAINE) 2 % jelly    Sig: Apply 1 Application topically as needed.    Dispense:  30 mL    Refill:  0   docusate sodium (COLACE) 100 MG capsule    Sig: Take 1 capsule (100 mg total) by mouth every 12 (twelve) hours.    Dispense:  60 capsule    Refill:  0    I have reviewed the patients home medicines and have made adjustments as needed  Problem List / ED Course: Problem List Items Addressed This Visit   None Visit Diagnoses     Rectal prolapse    -  Primary                   Final Clinical Impression(s) / ED Diagnoses Final diagnoses:  Rectal prolapse    Rx / DC Orders ED Discharge Orders          Ordered    lidocaine  (XYLOCAINE) 2 % jelly  As needed        05/15/23 0658    docusate sodium (COLACE) 100 MG capsule  Every 12 hours        05/15/23 0658              Shon Baton, MD 05/15/23 [phone]

## 2023-07-23 ENCOUNTER — Emergency Department (HOSPITAL_COMMUNITY)
Admission: EM | Admit: 2023-07-23 | Discharge: 2023-07-24 | Discharge status: Against Medical Advice | Payer: 59 | Attending: Emergency Medicine | Admitting: Emergency Medicine

## 2023-07-23 ENCOUNTER — Emergency Department (HOSPITAL_COMMUNITY): Payer: 59

## 2023-07-23 ENCOUNTER — Encounter (HOSPITAL_COMMUNITY): Payer: Self-pay | Admitting: Emergency Medicine

## 2023-07-23 ENCOUNTER — Other Ambulatory Visit: Payer: Self-pay

## 2023-07-23 DIAGNOSIS — Z5321 Procedure and treatment not carried out due to patient leaving prior to being seen by health care provider: Secondary | ICD-10-CM | POA: Insufficient documentation

## 2023-07-23 DIAGNOSIS — W19XXXA Unspecified fall, initial encounter: Secondary | ICD-10-CM | POA: Diagnosis not present

## 2023-07-23 DIAGNOSIS — M25552 Pain in left hip: Secondary | ICD-10-CM | POA: Insufficient documentation

## 2023-07-23 NOTE — ED Triage Notes (Signed)
Patient coming to ED for evaluation of  L hip pain.  Reports "I had a seizure and fell 3 days ago."  C/o pain with ambulation.  No numbness or tingling.  Hx of seizures.

## 2023-07-24 ENCOUNTER — Emergency Department (HOSPITAL_COMMUNITY)
Admission: EM | Admit: 2023-07-24 | Discharge: 2023-07-24 | Disposition: A | Discharge status: Home / Self Care | Payer: 59 | Source: Home / Self Care | Attending: Emergency Medicine | Admitting: Emergency Medicine

## 2023-07-24 DIAGNOSIS — M25552 Pain in left hip: Secondary | ICD-10-CM | POA: Insufficient documentation

## 2023-07-24 MED ORDER — LIDOCAINE 5 % EX PTCH
2.0000 | MEDICATED_PATCH | CUTANEOUS | Status: DC
  Administered 2023-07-24: 2 via TRANSDERMAL
  Filled 2023-07-24: qty 2

## 2023-07-24 MED ORDER — LIDOCAINE 5 % EX PTCH: 1.0000 | MEDICATED_PATCH | CUTANEOUS | 0 refills | Status: DC

## 2023-07-24 NOTE — ED Provider Notes (Signed)
[place] EMERGENCY DEPARTMENT AT [facility] Provider Note   CSN: [id number] Arrival date & time: 07/24/23  7425     History  Chief Complaint  Patient presents with   Hip Pain    Gwendolyn Hampton is a 37 y.o. female.  The history is provided by the patient.  Hip Pain This is a new problem. The current episode started more than 2 days ago (3 days ago, from a fall). The problem occurs constantly. The problem has not changed since onset.Nothing aggravates the symptoms. Nothing relieves the symptoms. Treatments tried: suboxone and gabapentin. The treatment provided no relief.  Patient fell 3 days ago onto the left hip and has been having pain since,  is able to ambulate.  Went to [facility] and had Xrays but did not stay d     Home Medications Prior to Admission medications   Medication Sig Start Date End Date Taking? Authorizing Provider  lidocaine (LIDODERM) 5 % Place 1 patch onto the skin daily. Remove & Discard patch within 12 hours or as directed by MD 07/24/23  Yes Nikolaos Maddocks, MD  Acetaminophen 500 MG capsule Take 2 capsules (1,000 mg total) by mouth every 6 (six) hours as needed. 10/18/22   Fayrene Helper, PA-C  cyclobenzaprine (FLEXERIL) 10 MG tablet Take 1 tablet (10 mg total) by mouth 2 (two) times daily as needed for muscle spasms. 12/31/22   Carroll Sage, PA-C  dicyclomine (BENTYL) 20 MG tablet Take 1 tablet (20 mg total) by mouth 2 (two) times daily. 03/19/19   Caccavale, Sophia, PA-C  docusate sodium (COLACE) 100 MG capsule Take 1 capsule (100 mg total) by mouth every 12 (twelve) hours. 05/15/23   Horton, Mayer Masker, MD  HYDROcodone-acetaminophen (NORCO/VICODIN) 5-325 MG tablet Take 1-2 tablets by mouth every 6 (six) hours as needed. 05/26/19   Benjiman Core, MD  lidocaine (XYLOCAINE) 2 % jelly Apply 1 Application topically as needed. 05/15/23   Horton, Mayer Masker, MD  nicotine (NICODERM CQ - DOSED IN MG/24 HR) 7 mg/24hr patch Place 1 patch (7 mg total) onto  the skin daily. 01/08/23 01/08/24  Pilar Jarvis, MD  nicotine polacrilex (NICOTINE MINI) 4 MG lozenge Take 1 lozenge (4 mg total) by mouth as needed. 01/08/23   Pilar Jarvis, MD  ondansetron (ZOFRAN-ODT) 4 MG disintegrating tablet 4mg  ODT q4 hours prn nausea/vomit 12/20/22   Melene Plan, DO      Allergies    Bee venom, Ibuprofen, Klonopin [clonazepam], Morphine and codeine, Sulfa antibiotics, and Tramadol    Review of Systems   Review of Systems  Constitutional:  Negative for fever.  HENT:  Negative for facial swelling.   Musculoskeletal:  Positive for arthralgias.  All other systems reviewed and are negative.   Physical Exam Updated Vital Signs BP 106/64 (BP Location: Right Arm)   Pulse 71   Temp 97.8 F (36.6 C) (Oral)   Resp 15   SpO2 100%  Physical Exam Vitals and nursing note reviewed.  Constitutional:      General: She is not in acute distress.    Appearance: Normal appearance. She is well-developed.  HENT:     Head: Normocephalic and atraumatic.     Nose: Nose normal.  Eyes:     Pupils: Pupils are equal, round, and reactive to light.  Cardiovascular:     Rate and Rhythm: Normal rate and regular rhythm.     Pulses: Normal pulses.     Heart sounds: Normal heart sounds.  Pulmonary:  Effort: Pulmonary effort is normal. No respiratory distress.     Breath sounds: Normal breath sounds.  Abdominal:     General: Bowel sounds are normal. There is no distension.     Palpations: Abdomen is soft.     Tenderness: There is no abdominal tenderness. There is no guarding or rebound.  Musculoskeletal:        General: No swelling or deformity. Normal range of motion.     Cervical back: Normal range of motion and neck supple.     Left hip: Normal.     Left upper leg: Normal.     Left knee: Normal. No LCL laxity, MCL laxity, ACL laxity or PCL laxity.    Instability Tests: Anterior drawer test negative. Posterior drawer test negative. Anterior Lachman test negative.     Left ankle:      Left Achilles Tendon: Normal.  Skin:    General: Skin is warm and dry.     Capillary Refill: Capillary refill takes less than 2 seconds.     Findings: No erythema or rash.  Neurological:     General: No focal deficit present.     Mental Status: She is alert and oriented to person, place, and time.     Deep Tendon Reflexes: Reflexes normal.  Psychiatric:        Thought Content: Thought content normal.     ED Results / Procedures / Treatments   Labs (all labs ordered are listed, but only abnormal results are displayed) Labs Reviewed - No data to display  EKG None  Radiology DG Hip Unilat With Pelvis 2-3 Views Left  Result Date: 07/24/2023 CLINICAL DATA:  Left hip pain following fall. EXAM: DG HIP (WITH OR WITHOUT PELVIS) 2-3V LEFT COMPARISON:  None Available. FINDINGS: There is no evidence of acute hip fracture or dislocation. There is no evidence of arthropathy or other focal bone abnormality. IMPRESSION: No acute fracture or dislocation. Electronically Signed   By: Thornell Sartorius M.D.   On: 07/24/2023 01:47    Procedures Procedures    Medications Ordered in ED Medications  lidocaine (LIDODERM) 5 % 2 patch (2 patches Transdermal Patch Applied 07/24/23 0153)    ED Course/ Medical Decision Making/ A&P                                 Medical Decision Making Patient with left hip pain since fall onto left hip 3 days ago   Amount and/or Complexity of Data Reviewed Independent Historian: spouse    Details: See above  External Data Reviewed: notes.    Details: Previous notes reviewed  Radiology: ordered and independent interpretation performed.    Details: Normal hip by me on XR  Risk Prescription drug management. Risk Details: Fall 3 days ago and has been ambulatory.  Well appearing,  FROM.  Exam and images are NOT consistent with fracture.  Stable for discharge.  Follow up with your pain management specialist for ongoing care.     Final Clinical Impression(s) / ED  Diagnoses Final diagnoses:  Left hip pain  Return for intractable cough, coughing up blood, fevers > 100.4 unrelieved by medication, shortness of breath, intractable vomiting, chest pain, shortness of breath, weakness, numbness, changes in speech, facial asymmetry, abdominal pain, passing out, Inability to tolerate liquids or food, cough, altered mental status or any concerns. No signs of systemic illness or infection. The patient is nontoxic-appearing on exam and vital signs  are within normal limits.  I have reviewed the triage vital signs and the nursing notes. Pertinent labs & imaging results that were available during my care of the patient were reviewed by me and considered in my medical decision making (see chart for details). After history, exam, and medical workup I feel the patient has been appropriately medically screened and is safe for discharge home. Pertinent diagnoses were discussed with the patient. Patient was given return precautions.       Rx / DC Orders ED Discharge Orders          Ordered    lidocaine (LIDODERM) 5 %  Every 24 hours        07/24/23 0229              Aram Domzalski, MD 07/24/23 [phone]

## 2023-07-24 NOTE — ED Notes (Signed)
Pt stated the wait here is too long and she is going to go to [facility] ED instead. Pt see leaving the ED with family.

## 2023-07-24 NOTE — ED Triage Notes (Signed)
Pt reports left [facility] to come here d/t long wait, few x-ray completed, not sure which were left. Pt reports 3 days ago had seizure and fell on left hip. Pt reports since has had shooting pain from hip to foot. Pt crying getting into bed

## 2023-07-30 ENCOUNTER — Encounter: Payer: Self-pay | Admitting: Emergency Medicine

## 2023-07-30 ENCOUNTER — Other Ambulatory Visit: Payer: Self-pay

## 2023-07-30 ENCOUNTER — Emergency Department
Admission: EM | Admit: 2023-07-30 | Discharge: 2023-07-30 | Disposition: A | Discharge status: Home / Self Care | Payer: 59 | Attending: Student in an Organized Health Care Education/Training Program | Admitting: Student in an Organized Health Care Education/Training Program

## 2023-07-30 DIAGNOSIS — L02412 Cutaneous abscess of left axilla: Secondary | ICD-10-CM | POA: Diagnosis present

## 2023-07-30 MED ORDER — CLINDAMYCIN HCL 150 MG PO CAPS: 450.0000 mg | ORAL_CAPSULE | Freq: Three times a day (TID) | ORAL | 0 refills | Status: AC

## 2023-07-30 MED ORDER — LIDOCAINE HCL (PF) 1 % IJ SOLN
5.0000 mL | Freq: Once | INTRAMUSCULAR | Status: AC
  Administered 2023-07-30: 5 mL via INTRADERMAL
  Filled 2023-07-30: qty 5

## 2023-07-30 NOTE — ED Notes (Signed)
See triage note  Presents with possible abscess area to left [facility] area  States she noticed this about 1 week ago  Area is tender to touch   no fever

## 2023-07-30 NOTE — Discharge Instructions (Signed)
Please wash the wound daily with soap and water.  You can then cover with gauze or Band-Aid.  After 2 days please remove the packing.  You just pull it out.  After removing the packing you can apply warm compresses to the area to encourage any remaining infection to drain out.
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
## 2023-07-30 NOTE — ED Triage Notes (Signed)
Patient to ED via POV for abscess to left AC. States it has been there 1 week.

## 2023-12-23 ENCOUNTER — Other Ambulatory Visit: Payer: Self-pay

## 2023-12-23 ENCOUNTER — Encounter: Payer: Self-pay | Admitting: Psychiatry

## 2023-12-23 ENCOUNTER — Emergency Department
Admission: EM | Admit: 2023-12-23 | Discharge: 2023-12-24 | Disposition: A | Discharge status: Other Acute Inpatient Hospital | Attending: Emergency Medicine | Admitting: Emergency Medicine

## 2023-12-23 DIAGNOSIS — F1123 Opioid dependence with withdrawal: Secondary | ICD-10-CM | POA: Diagnosis not present

## 2023-12-23 DIAGNOSIS — R443 Hallucinations, unspecified: Secondary | ICD-10-CM | POA: Diagnosis present

## 2023-12-23 DIAGNOSIS — F259 Schizoaffective disorder, unspecified: Secondary | ICD-10-CM | POA: Insufficient documentation

## 2023-12-23 DIAGNOSIS — F251 Schizoaffective disorder, depressive type: Secondary | ICD-10-CM | POA: Diagnosis present

## 2023-12-23 DIAGNOSIS — F1193 Opioid use, unspecified with withdrawal: Secondary | ICD-10-CM | POA: Diagnosis present

## 2023-12-23 LAB — COMPREHENSIVE METABOLIC PANEL WITH GFR
ALT: 31 U/L (ref 0–44)
AST: 54 U/L — ABNORMAL HIGH (ref 15–41)
Albumin: 4.2 g/dL (ref 3.5–5.0)
Alkaline Phosphatase: 115 U/L (ref 38–126)
Anion gap: 12 (ref 5–15)
BUN: 7 mg/dL (ref 6–20)
CO2: 23 mmol/L (ref 22–32)
Calcium: 9.5 mg/dL (ref 8.9–10.3)
Chloride: 109 mmol/L (ref 98–111)
Creatinine, Ser: 0.78 mg/dL (ref 0.44–1.00)
GFR, Estimated: >60 mL/min (ref >60)
Glucose, Bld: 110 mg/dL — ABNORMAL HIGH (ref 70–99)
Potassium: 3.8 mmol/L (ref 3.5–5.1)
Sodium: 144 mmol/L (ref 135–145)
Total Bilirubin: 0.9 mg/dL (ref 0.0–1.2)
Total Protein: 8.4 g/dL — ABNORMAL HIGH (ref 6.5–8.1)

## 2023-12-23 LAB — CBC WITH DIFFERENTIAL/PLATELET
Abs Immature Granulocytes: 0.05 10*3/uL (ref 0.00–0.07)
Basophils Absolute: 0 10*3/uL (ref 0.0–0.1)
Basophils Relative: 0 %
Eosinophils Absolute: 0 10*3/uL (ref 0.0–0.5)
Eosinophils Relative: 0 %
HCT: 33.4 % — ABNORMAL LOW (ref 36.0–46.0)
Hemoglobin: 11.2 g/dL — ABNORMAL LOW (ref 12.0–15.0)
Immature Granulocytes: 1 %
Lymphocytes Relative: 19 %
Lymphs Abs: 1.9 10*3/uL (ref 0.7–4.0)
MCH: 28.8 pg (ref 26.0–34.0)
MCHC: 33.5 g/dL (ref 30.0–36.0)
MCV: 85.9 fL (ref 80.0–100.0)
Monocytes Absolute: 0.4 10*3/uL (ref 0.1–1.0)
Monocytes Relative: 4 %
Neutro Abs: 7.5 10*3/uL (ref 1.7–7.7)
Neutrophils Relative %: 76 %
Platelets: 300 10*3/uL (ref 150–400)
RBC: 3.89 MIL/uL (ref 3.87–5.11)
RDW: 13.2 % (ref 11.5–15.5)
WBC: 9.9 10*3/uL (ref 4.0–10.5)
nRBC: 0 % (ref 0.0–0.2)

## 2023-12-23 LAB — URINALYSIS, ROUTINE W REFLEX MICROSCOPIC
Bilirubin Urine: NEGATIVE
Glucose, UA: NEGATIVE mg/dL
Hgb urine dipstick: NEGATIVE
Ketones, ur: NEGATIVE mg/dL
Leukocytes,Ua: NEGATIVE
Nitrite: NEGATIVE
Protein, ur: NEGATIVE mg/dL
Specific Gravity, Urine: 1.012 (ref 1.005–1.030)
pH: 7 (ref 5.0–8.0)

## 2023-12-23 LAB — URINE DRUG SCREEN, QUALITATIVE (ARMC ONLY)
Amphetamines, Ur Screen: NOT DETECTED
Barbiturates, Ur Screen: NOT DETECTED
Benzodiazepine, Ur Scrn: NOT DETECTED
Cannabinoid 50 Ng, Ur ~~LOC~~: NOT DETECTED
Cocaine Metabolite,Ur ~~LOC~~: NOT DETECTED
MDMA (Ecstasy)Ur Screen: NOT DETECTED
Methadone Scn, Ur: NOT DETECTED
Opiate, Ur Screen: POSITIVE — AB
Phencyclidine (PCP) Ur S: NOT DETECTED
Tricyclic, Ur Screen: NOT DETECTED

## 2023-12-23 LAB — POC URINE PREG, ED: Preg Test, Ur: NEGATIVE

## 2023-12-23 LAB — ETHANOL: Alcohol, Ethyl (B): <10 mg/dL (ref <10)

## 2023-12-23 LAB — LIPASE, BLOOD: Lipase: 23 U/L (ref 11–51)

## 2023-12-23 LAB — SALICYLATE LEVEL: Salicylate Lvl: <7 mg/dL — ABNORMAL LOW (ref 7.0–30.0)

## 2023-12-23 LAB — ACETAMINOPHEN LEVEL: Acetaminophen (Tylenol), Serum: 11 ug/mL (ref 10–30)

## 2023-12-23 MED ORDER — SODIUM CHLORIDE 0.9 % IV SOLN
12.5000 mg | Freq: Once | INTRAVENOUS | Status: AC
  Administered 2023-12-23: 12.5 mg via INTRAVENOUS
  Filled 2023-12-23: qty 12.5

## 2023-12-23 MED ORDER — BUTALBITAL-APAP-CAFFEINE 50-325-40 MG PO TABS
1.0000 | ORAL_TABLET | Freq: Once | ORAL | Status: AC
  Administered 2023-12-23: 1 via ORAL
  Filled 2023-12-23: qty 1

## 2023-12-23 MED ORDER — PROMETHAZINE HCL 12.5 MG PO TABS: 12.5000 mg | ORAL_TABLET | Freq: Four times a day (QID) | ORAL | 0 refills | Status: AC | PRN

## 2023-12-23 MED ORDER — HYDROXYZINE HCL 25 MG PO TABS: 25.0000 mg | ORAL_TABLET | Freq: Three times a day (TID) | ORAL | 0 refills | Status: DC | PRN

## 2023-12-23 MED ORDER — HYDROXYZINE HCL 25 MG PO TABS
50.0000 mg | ORAL_TABLET | Freq: Once | ORAL | Status: AC
  Administered 2023-12-23: 50 mg via ORAL
  Filled 2023-12-23: qty 2

## 2023-12-23 MED ORDER — ACETAMINOPHEN 325 MG PO TABS
650.0000 mg | ORAL_TABLET | Freq: Four times a day (QID) | ORAL | Status: DC | PRN
  Administered 2023-12-23: 650 mg via ORAL
  Filled 2023-12-23: qty 2

## 2023-12-23 MED ORDER — CLONIDINE HCL 0.1 MG PO TABS
0.1000 mg | ORAL_TABLET | Freq: Once | ORAL | Status: AC
  Administered 2023-12-23: 0.1 mg via ORAL
  Filled 2023-12-23: qty 1

## 2023-12-23 MED ORDER — ACETAMINOPHEN 325 MG PO TABS
650.0000 mg | ORAL_TABLET | Freq: Once | ORAL | Status: AC
  Administered 2023-12-23: 650 mg via ORAL
  Filled 2023-12-23: qty 2

## 2023-12-23 MED ORDER — ONDANSETRON 4 MG PO TBDP
4.0000 mg | ORAL_TABLET | Freq: Four times a day (QID) | ORAL | Status: DC | PRN
  Administered 2023-12-23: 4 mg via ORAL
  Filled 2023-12-23: qty 1

## 2023-12-23 MED ORDER — SODIUM CHLORIDE 0.9 % IV BOLUS
1000.0000 mL | Freq: Once | INTRAVENOUS | Status: AC
  Administered 2023-12-23: 1000 mL via INTRAVENOUS

## 2023-12-23 NOTE — ED Notes (Signed)
 Dr Synetta Eves notified and aware of HR bouncing between 90 and 120.
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
## 2023-12-23 NOTE — ED Notes (Signed)
 Pt belongings: Socks Jeans White tank top Pink and purple Brooks shoes Puple shorts Tan colored underwear

## 2023-12-23 NOTE — ED Provider Notes (Signed)
 [facility] Provider Note    Event Date/Time   First MD Initiated Contact with Patient 12/23/23 1500     (approximate)   History   Withdrawal   HPI  Gwendolyn Hampton is a 38 year old female presenting to the emergency department for evaluation of drug withdrawal.  Patient reports that she uses "street drugs", primarily fentanyl , but has used other drugs as well.  Last use was on Sunday, trying to get clean.  Ports she has been having ongoing withdrawal symptoms since that time.  Tried taking Suboxone on Wednesday, but felt that it worsened her symptoms and she does not wish to try this again.  Reports body aches, nausea, vomiting.  Later on reevalation, patient noted to be responding to internal stimuli with reported visual and auditory hallucinations.  See MDM below for further details.    Physical Exam   Triage Vital Signs: ED Triage Vitals  Encounter Vitals Group     BP 12/23/23 1439 (!) 155/90     Systolic BP Percentile --      Diastolic BP Percentile --      Pulse Rate 12/23/23 1439 (!) 154     Resp 12/23/23 1439 18     Temp 12/23/23 1439 98.3 F (36.8 C)     Temp Source 12/23/23 1439 Axillary     SpO2 12/23/23 1439 97 %     Weight --      Height 12/23/23 1442 5\' 1"  (1.549 m)     Head Circumference --      Peak Flow --      Pain Score 12/23/23 1441 10     Pain Loc --      Pain Education --      Exclude from Growth Chart --     Most recent vital signs: Vitals:   12/23/23 1900 12/23/23 1915  BP: 138/83   Pulse:  76  Resp:  16  Temp:    SpO2:  100%     General: Awake, appears uncomfortable CV:  Tachycardic, good peripheral perfusion  Resp:  Unlabored respirations, lungs clear to auscultation Abd:  Nondistended, soft, no significant tenderness to palpation Neuro:  Symmetric facial movement, fluid speech   ED Results / Procedures / Treatments   Labs (all labs ordered are listed, but only abnormal results are displayed) Labs  Reviewed  COMPREHENSIVE METABOLIC PANEL WITH GFR - Abnormal; Notable for the following components:      Result Value   Glucose, Bld 110 (*)    Total Protein 8.4 (*)    AST 54 (*)    All other components within normal limits  URINALYSIS, ROUTINE W REFLEX MICROSCOPIC - Abnormal; Notable for the following components:   Color, Urine YELLOW (*)    APPearance CLEAR (*)    All other components within normal limits  CBC WITH DIFFERENTIAL/PLATELET - Abnormal; Notable for the following components:   Hemoglobin 11.2 (*)    HCT 33.4 (*)    All other components within normal limits  SALICYLATE LEVEL - Abnormal; Notable for the following components:   Salicylate Lvl <7.0 (*)    All other components within normal limits  URINE DRUG SCREEN, QUALITATIVE ([facility] ONLY) - Abnormal; Notable for the following components:   Opiate, Ur Screen POSITIVE (*)    All other components within normal limits  LIPASE, BLOOD  ACETAMINOPHEN  LEVEL  ETHANOL  CBC WITH DIFFERENTIAL/PLATELET  POC URINE PREG, ED     EKG EKG independently reviewed interpreted by myself (ER  attending) demonstrates:  EKG demonstrates sinus rhythm at a rate of 87, PR 170, QRS 82, QTc 423, no acute ST changes  RADIOLOGY Imaging independently reviewed and interpreted by myself demonstrates:   Formal Radiology Read:  No results found.  PROCEDURES:  Critical Care performed: No  Procedures   MEDICATIONS ORDERED IN ED: Medications  promethazine  (PHENERGAN ) 12.5 mg in sodium chloride  0.9 % 50 mL IVPB (0 mg Intravenous Stopped 12/23/23 1626)  cloNIDine  (CATAPRES ) tablet 0.1 mg (0.1 mg Oral Given 12/23/23 1602)  acetaminophen  (TYLENOL ) tablet 650 mg (650 mg Oral Given 12/23/23 1602)  hydrOXYzine  (ATARAX ) tablet 50 mg (50 mg Oral Given 12/23/23 1602)  sodium chloride  0.9 % bolus 1,000 mL (0 mLs Intravenous Stopped 12/23/23 1817)  butalbital -acetaminophen -caffeine  (FIORICET ) 50-325-40 MG per tablet 1 tablet (1 tablet Oral Given 12/23/23 1924)      IMPRESSION / MDM / ASSESSMENT AND PLAN / ED COURSE  I reviewed the triage vital signs and the nursing notes.  Differential diagnosis includes, but is not limited to, opiate withdrawal, other drug withdrawal, anemia, electrolyte abnormality, arrhythmia, low suspicion significant cute intra-abdominal process given reassuring abdominal exam  Patient's presentation is most consistent with acute presentation with potential threat to life or bodily function.  38 year old female presenting to the emergency department with withdrawal symptoms.  Tachycardic on presentation.  EKG without acute ischemic findings.  COWS score 20, I did discuss treatment with buprenorphine after discussing that sometimes additional dosing is required if she had does have precipitated withdrawal.  In the setting of this, we will plan for adjunctive therapies with Phenergan , clonidine , Tylenol , hydroxyzine  and fluids.  10:48 PM Patient reassessed.  Reported some improvement in her withdrawal symptoms.  Tachycardia improved with heart rates ranging from the 80s to low 100s.  Patient to be medically cleared at this time, however, patient appears to be responding to internal stimuli on reevaluation.  Initially reported letters moving around the room.  Later tells me that she saw her mother in the room telling her that she was going to be okay.  Tells me that her mother has been dead for a year.  Denies history of hallucinations in the past.  Denies SI, HI.  With clinical history I am concerned about possible psychosis in the setting of opiate withdrawal.  Will consult psychiatry and TTS for further evaluation.      FINAL CLINICAL IMPRESSION(S) / ED DIAGNOSES   Final diagnoses:  Opiate withdrawal (HCC)  Hallucinations     Rx / DC Orders   ED Discharge Orders          Ordered    promethazine  (PHENERGAN ) 12.5 MG tablet  Every 6 hours PRN        12/23/23 1747    hydrOXYzine  (ATARAX ) 25 MG tablet  3 times daily PRN         12/23/23 1747             Note:  This document was prepared using Dragon voice recognition software and may include unintentional dictation errors.   Claria Crofts, MD 12/23/23 [phone]
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
## 2023-12-23 NOTE — Discharge Instructions (Addendum)
 You were seen in the ER today for your withdrawal symptoms.  Your testing was overall reassuring against an emergency cause for your symptoms.  I sent a prescription for an as needed nausea medication and medication for anxiety.  I have included information for outpatient and residential substance abuse resources in the area.  Return to the ER for new or worsening symptoms.

## 2023-12-23 NOTE — ED Triage Notes (Signed)
 Pt here with c/o withdrawal symptoms from fentanyl  of dizziness, NH/V/D. Pt decided last Sunday to get clean and the last time they used was last Sunday and symptoms started about 5 hours after the last use. Pt wants to get clean and is here with their s/o who is also a current pt dealing with withdrawls. Pt is visibly shaking during triage. Pt is A&Ox3 during triage.

## 2023-12-24 ENCOUNTER — Inpatient Hospital Stay
Admission: AD | Admit: 2023-12-24 | Discharge: 2023-12-24 | DRG: 897 | Disposition: A | Discharge status: Home / Self Care | Source: Intra-hospital | Attending: Psychiatry | Admitting: Psychiatry

## 2023-12-24 ENCOUNTER — Encounter: Payer: Self-pay | Admitting: Psychiatry

## 2023-12-24 DIAGNOSIS — Z5982 Transportation insecurity: Secondary | ICD-10-CM

## 2023-12-24 DIAGNOSIS — F259 Schizoaffective disorder, unspecified: Secondary | ICD-10-CM | POA: Diagnosis present

## 2023-12-24 DIAGNOSIS — Z91030 Bee allergy status: Secondary | ICD-10-CM | POA: Diagnosis not present

## 2023-12-24 DIAGNOSIS — Z888 Allergy status to other drugs, medicaments and biological substances status: Secondary | ICD-10-CM | POA: Diagnosis not present

## 2023-12-24 DIAGNOSIS — F17210 Nicotine dependence, cigarettes, uncomplicated: Secondary | ICD-10-CM | POA: Diagnosis present

## 2023-12-24 DIAGNOSIS — R443 Hallucinations, unspecified: Secondary | ICD-10-CM | POA: Diagnosis present

## 2023-12-24 DIAGNOSIS — F319 Bipolar disorder, unspecified: Secondary | ICD-10-CM | POA: Diagnosis present

## 2023-12-24 DIAGNOSIS — Z716 Tobacco abuse counseling: Secondary | ICD-10-CM

## 2023-12-24 DIAGNOSIS — F1193 Opioid use, unspecified with withdrawal: Secondary | ICD-10-CM

## 2023-12-24 DIAGNOSIS — Z811 Family history of alcohol abuse and dependence: Secondary | ICD-10-CM

## 2023-12-24 DIAGNOSIS — F419 Anxiety disorder, unspecified: Secondary | ICD-10-CM | POA: Diagnosis present

## 2023-12-24 DIAGNOSIS — F909 Attention-deficit hyperactivity disorder, unspecified type: Secondary | ICD-10-CM | POA: Diagnosis present

## 2023-12-24 DIAGNOSIS — Z813 Family history of other psychoactive substance abuse and dependence: Secondary | ICD-10-CM | POA: Diagnosis not present

## 2023-12-24 DIAGNOSIS — Z62810 Personal history of physical and sexual abuse in childhood: Secondary | ICD-10-CM | POA: Diagnosis not present

## 2023-12-24 DIAGNOSIS — Z818 Family history of other mental and behavioral disorders: Secondary | ICD-10-CM | POA: Diagnosis not present

## 2023-12-24 DIAGNOSIS — Z886 Allergy status to analgesic agent status: Secondary | ICD-10-CM

## 2023-12-24 DIAGNOSIS — Z882 Allergy status to sulfonamides status: Secondary | ICD-10-CM

## 2023-12-24 DIAGNOSIS — Z885 Allergy status to narcotic agent status: Secondary | ICD-10-CM

## 2023-12-24 DIAGNOSIS — Z9152 Personal history of nonsuicidal self-harm: Secondary | ICD-10-CM

## 2023-12-24 DIAGNOSIS — F251 Schizoaffective disorder, depressive type: Secondary | ICD-10-CM

## 2023-12-24 MED ORDER — DICYCLOMINE HCL 20 MG PO TABS
20.0000 mg | ORAL_TABLET | Freq: Four times a day (QID) | ORAL | Status: DC | PRN
  Administered 2023-12-24: 20 mg via ORAL
  Filled 2023-12-24 (×2): qty 1

## 2023-12-24 MED ORDER — CLONIDINE HCL 0.1 MG PO TABS
0.1000 mg | ORAL_TABLET | Freq: Three times a day (TID) | ORAL | Status: DC | PRN
  Administered 2023-12-24: 0.1 mg via ORAL
  Filled 2023-12-24: qty 1

## 2023-12-24 MED ORDER — CLONIDINE HCL 0.1 MG PO TABS: 0.1000 mg | ORAL_TABLET | Freq: Two times a day (BID) | ORAL | Status: DC

## 2023-12-24 MED ORDER — OLANZAPINE 10 MG IM SOLR: 5.0000 mg | Freq: Three times a day (TID) | INTRAMUSCULAR | Status: DC | PRN

## 2023-12-24 MED ORDER — TRAZODONE HCL 50 MG PO TABS: 50.0000 mg | ORAL_TABLET | Freq: Every evening | ORAL | Status: DC | PRN

## 2023-12-24 MED ORDER — NICOTINE 14 MG/24HR TD PT24: 14.0000 mg | MEDICATED_PATCH | Freq: Every day | TRANSDERMAL | 0 refills | Status: AC

## 2023-12-24 MED ORDER — METHOCARBAMOL 500 MG PO TABS: 500.0000 mg | ORAL_TABLET | Freq: Three times a day (TID) | ORAL | Status: DC | PRN

## 2023-12-24 MED ORDER — HYDROXYZINE HCL 25 MG PO TABS
25.0000 mg | ORAL_TABLET | Freq: Four times a day (QID) | ORAL | Status: DC | PRN
  Administered 2023-12-24: 25 mg via ORAL
  Filled 2023-12-24: qty 1

## 2023-12-24 MED ORDER — ACETAMINOPHEN 325 MG PO TABS
650.0000 mg | ORAL_TABLET | Freq: Four times a day (QID) | ORAL | Status: DC | PRN
  Administered 2023-12-24: 650 mg via ORAL
  Filled 2023-12-24: qty 2

## 2023-12-24 MED ORDER — METHOCARBAMOL 500 MG PO TABS
500.0000 mg | ORAL_TABLET | Freq: Three times a day (TID) | ORAL | Status: DC | PRN
  Administered 2023-12-24: 500 mg via ORAL
  Filled 2023-12-24: qty 1

## 2023-12-24 MED ORDER — HALOPERIDOL 5 MG PO TABS: 5.0000 mg | ORAL_TABLET | Freq: Three times a day (TID) | ORAL | Status: DC | PRN

## 2023-12-24 MED ORDER — CLONIDINE HCL 0.1 MG PO TABS: 0.1000 mg | ORAL_TABLET | Freq: Every day | ORAL | Status: DC

## 2023-12-24 MED ORDER — DICYCLOMINE HCL 20 MG PO TABS
20.0000 mg | ORAL_TABLET | Freq: Four times a day (QID) | ORAL | Status: DC | PRN
  Administered 2023-12-24 (×2): 20 mg via ORAL
  Filled 2023-12-24 (×4): qty 1

## 2023-12-24 MED ORDER — NICOTINE 14 MG/24HR TD PT24: 14.0000 mg | MEDICATED_PATCH | Freq: Every day | TRANSDERMAL | Status: DC

## 2023-12-24 MED ORDER — CLONIDINE HCL 0.1 MG PO TABS: 0.1000 mg | ORAL_TABLET | Freq: Four times a day (QID) | ORAL | Status: DC

## 2023-12-24 MED ORDER — ALUM & MAG HYDROXIDE-SIMETH 200-200-20 MG/5ML PO SUSP: 30.0000 mL | ORAL | Status: DC | PRN

## 2023-12-24 MED ORDER — OLANZAPINE 10 MG IM SOLR: 10.0000 mg | Freq: Three times a day (TID) | INTRAMUSCULAR | Status: DC | PRN

## 2023-12-24 MED ORDER — ONDANSETRON 4 MG PO TBDP
4.0000 mg | ORAL_TABLET | Freq: Four times a day (QID) | ORAL | Status: DC | PRN
  Administered 2023-12-24 (×2): 4 mg via ORAL
  Filled 2023-12-24 (×2): qty 1

## 2023-12-24 MED ORDER — LOPERAMIDE HCL 2 MG PO CAPS: 2.0000 mg | ORAL_CAPSULE | ORAL | Status: DC | PRN

## 2023-12-24 MED ORDER — MAGNESIUM HYDROXIDE 400 MG/5ML PO SUSP: 30.0000 mL | Freq: Every day | ORAL | Status: DC | PRN

## 2023-12-24 MED ORDER — QUETIAPINE FUMARATE 25 MG PO TABS
50.0000 mg | ORAL_TABLET | Freq: Two times a day (BID) | ORAL | Status: DC | PRN
Start: 2023-12-23 — End: 2023-12-24

## 2023-12-24 MED ORDER — DIPHENHYDRAMINE HCL 25 MG PO CAPS: 50.0000 mg | ORAL_CAPSULE | Freq: Three times a day (TID) | ORAL | Status: DC | PRN

## 2023-12-24 NOTE — [facility] Counselor (Addendum)
 CSW met with pt to attempt to schedule follow up appointment. Pt was given contact information for [facility] to schedule appointment. CSW offered to assist with scheduling appointment at that time via phone. Pt declined stating that she would schedule appointment on her own after discharge. No other concerns expressed. Contact ended without incident.   Elvie Hammed. Johnnye Nancy, MSW, LCSW, LCAS 12/24/2023 3:14 PM

## 2023-12-24 NOTE — Plan of Care (Signed)

## 2023-12-24 NOTE — Group Note (Signed)
 [facility] LCSW Group Therapy Note    Group Date: 12/24/2023 Start Time: 1300 End Time: 1400  Type of Therapy and Topic:  Group Therapy:  Overcoming Obstacles  Participation Level:  [facility] PARTICIPATION LEVEL: Minimal   Description of Group:   In this group patients will be encouraged to explore what they see as obstacles to their own wellness and recovery. They will be guided to discuss their thoughts, feelings, and behaviors related to these obstacles. The group will process together ways to cope with barriers, with attention given to specific choices patients can make. Each patient will be challenged to identify changes they are motivated to make in order to overcome their obstacles. This group will be process-oriented, with patients participating in exploration of their own experiences as well as giving and receiving support and challenge from other group members.  Therapeutic Goals: 1. Patient will identify personal and current obstacles as they relate to admission. 2. Patient will identify barriers that currently interfere with their wellness or overcoming obstacles.  3. Patient will identify feelings, thought process and behaviors related to these barriers. 4. Patient will identify two changes they are willing to make to overcome these obstacles:    Summary of Patient Progress Patient came into group during the last few minutes. She shared that spending time with family and her children were positive coping skills that she utilizes. Insight into the topic and herself remain questionable.    Therapeutic Modalities:   Cognitive Behavioral Therapy Solution Focused Therapy Motivational Interviewing Relapse Prevention Therapy   Randolm Butte, LCSW

## 2023-12-24 NOTE — Progress Notes (Signed)
 Pt admitted to floor for substance use withdrawal. Pt denies SI/HI/AVH. States only her to withdrawal. Pt states " I use IV Fentanyl . Per pt Husband is medical floor.     12/24/23 0730  Psych Admission Type (Psych Patients Only)  Admission Status Voluntary  Psychosocial Assessment  Patient Complaints Anxiety;Insomnia;Restlessness;Substance abuse  Eye Contact Fair  Facial Expression Anxious  Affect Anxious;Preoccupied;Apprehensive  Speech Soft  Interaction Assertive  Motor Activity Tremors;Slow  Appearance/Hygiene In scrubs;Poor hygiene  Behavior Characteristics Anxious;Restless  Mood Preoccupied;Pleasant;Anxious  Thought Process  Coherency WDL  Content WDL  Delusions None reported or observed  Perception WDL  Hallucination None reported or observed  Judgment Impaired  Confusion None  Danger to Self  Current suicidal ideation? Denies  Danger to Others  Danger to Others None reported or observed

## 2023-12-24 NOTE — ED Notes (Signed)
 Patient reports mild abd cramping and nausea and also some anxiety and difficulty sleeping at this time.  See MAR.

## 2023-12-24 NOTE — ED Notes (Signed)
 VOL/ Consult Ordered/Completed/Inpt psych admission recommended
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
## 2023-12-24 NOTE — Progress Notes (Addendum)
 Discharge Note:  Patient denies SI/HI/AVH at this time. Discharge instructions, AVS, prescriptions, and transition record gone over with patient. Patient agrees to comply with medication management, follow-up visit, and outpatient therapy. Patient belongings returned to patient. Patient questions and concerns addressed and answered. Patient ambulatory off unit. DC home, but going ro medical floor to see husband that is admitted on 2 floor. Pt refused to complete suicide safety plan. States " I am not suicidal"  educated Pt that she could use to create a plan to stay in abstinent from drugs. Pt declined

## 2023-12-24 NOTE — BH IP Treatment Plan (Signed)
 Interdisciplinary Treatment and Diagnostic Plan Update  12/24/2023 Time of Session: 10:09  Gwendolyn Hampton MRN: [id number]  Principal Diagnosis: Schizoaffective disorder [facility])  Secondary Diagnoses: Principal Problem:   Schizoaffective disorder (HCC)   Current Medications:  Current Facility-Administered Medications  Medication Dose Route Frequency Provider Last Rate Last Admin   acetaminophen  (TYLENOL ) tablet 650 mg  650 mg Oral Q6H PRN Bobbitt, Shalon E, NP   650 mg at 12/24/23 0849   alum & mag hydroxide-simeth (MAALOX/MYLANTA) 200-200-20 MG/5ML suspension 30 mL  30 mL Oral Q4H PRN Bobbitt, Shalon E, NP       cloNIDine  (CATAPRES ) tablet 0.1 mg  0.1 mg Oral TID PRN Tingling, Stephanie, PA-C   0.1 mg at 12/24/23 5621   dicyclomine  (BENTYL ) tablet 20 mg  20 mg Oral Q6H PRN Jadapalle, Sree, MD   20 mg at 12/24/23 3086   haloperidol  (HALDOL ) tablet 5 mg  5 mg Oral TID PRN Bobbitt, Shalon E, NP       And   diphenhydrAMINE  (BENADRYL ) capsule 50 mg  50 mg Oral TID PRN Bobbitt, Shalon E, NP       hydrOXYzine  (ATARAX ) tablet 25 mg  25 mg Oral Q6H PRN Jadapalle, Sree, MD   25 mg at 12/24/23 0848   loperamide  (IMODIUM ) capsule 2-4 mg  2-4 mg Oral PRN Jadapalle, Sree, MD       magnesium  hydroxide (MILK OF MAGNESIA) suspension 30 mL  30 mL Oral Daily PRN Bobbitt, Shalon E, NP       methocarbamol  (ROBAXIN ) tablet 500 mg  500 mg Oral Q8H PRN Jadapalle, Sree, MD   500 mg at 12/24/23 0849   nicotine  (NICODERM CQ  - dosed in mg/24 hours) patch 14 mg  14 mg Transdermal Daily Tingling, Stephanie, PA-C       OLANZapine  (ZYPREXA ) injection 10 mg  10 mg Intramuscular TID PRN Bobbitt, Shalon E, NP       OLANZapine  (ZYPREXA ) injection 5 mg  5 mg Intramuscular TID PRN Bobbitt, Shalon E, NP       ondansetron  (ZOFRAN -ODT) disintegrating tablet 4 mg  4 mg Oral Q6H PRN Jadapalle, Sree, MD   4 mg at 12/24/23 0849   traZODone  (DESYREL ) tablet 50 mg  50 mg Oral QHS PRN Bobbitt, Shalon E, NP       PTA  Medications: Medications Prior to Admission  Medication Sig Dispense Refill Last Dose/Taking   Acetaminophen  500 MG capsule Take 2 capsules (1,000 mg total) by mouth every 6 (six) hours as needed. 30 capsule 0    Buprenorphine HCl-Naloxone HCl 8-2 MG FILM Allow 3 strips to dissolve under the tongue daily. May split dose.      cloNIDine  (CATAPRES ) 0.1 MG tablet Take 0.1 mg by mouth 3 (three) times daily.      cyclobenzaprine  (FLEXERIL ) 10 MG tablet Take 1 tablet (10 mg total) by mouth 2 (two) times daily as needed for muscle spasms. (Patient not taking: Reported on 12/23/2023) 20 tablet 0    dicyclomine  (BENTYL ) 10 MG capsule Take 10 mg by mouth 2 (two) times daily as needed.      dicyclomine  (BENTYL ) 20 MG tablet Take 1 tablet (20 mg total) by mouth 2 (two) times daily. (Patient not taking: Reported on 12/23/2023) 20 tablet 0    docusate sodium  (COLACE) 100 MG capsule Take 1 capsule (100 mg total) by mouth every 12 (twelve) hours. (Patient not taking: Reported on 12/23/2023) 60 capsule 0    gabapentin (NEURONTIN) 800 MG tablet Take 800 mg by  mouth 3 (three) times daily.      hydrOXYzine  (ATARAX ) 25 MG tablet Take 1 tablet (25 mg total) by mouth 3 (three) times daily as needed for anxiety. 30 tablet 0    lidocaine  (LIDODERM ) 5 % Place 1 patch onto the skin daily. Remove & Discard patch within 12 hours or as directed by MD (Patient not taking: Reported on 12/23/2023) 30 patch 0    lidocaine  (XYLOCAINE ) 2 % jelly Apply 1 Application topically as needed. (Patient not taking: Reported on 12/23/2023) 30 mL 0    mirtazapine (REMERON) 15 MG tablet Take 15 mg by mouth at bedtime.      nicotine  (NICODERM CQ  - DOSED IN MG/24 HR) 7 mg/24hr patch Place 1 patch (7 mg total) onto the skin daily. 90 patch 3    nicotine  polacrilex (NICOTINE  MINI) 4 MG lozenge Take 1 lozenge (4 mg total) by mouth as needed. (Patient not taking: Reported on 12/23/2023) 100 tablet 0    ondansetron  (ZOFRAN ) 8 MG tablet Take by mouth.       ondansetron  (ZOFRAN -ODT) 4 MG disintegrating tablet 4mg  ODT q4 hours prn nausea/vomit (Patient not taking: Reported on 12/23/2023) 20 tablet 0    promethazine  (PHENERGAN ) 12.5 MG tablet Take 1 tablet (12.5 mg total) by mouth every 6 (six) hours as needed for nausea or vomiting. 30 tablet 0     Patient Stressors: Financial difficulties   Occupational concerns   Substance abuse    Patient Strengths: Ability for insight   Treatment Modalities: Medication Management, Group therapy, Case management,  1 to 1 session with clinician, Psychoeducation, Recreational therapy.   Physician Treatment Plan for Primary Diagnosis: Schizoaffective disorder (HCC) Long Term Goal(s):     Short Term Goals:    Medication Management: Evaluate patient's response, side effects, and tolerance of medication regimen.  Therapeutic Interventions: 1 to 1 sessions, Unit Group sessions and Medication administration.  Evaluation of Outcomes: Not Met  Physician Treatment Plan for Secondary Diagnosis: Principal Problem:   Schizoaffective disorder (HCC)  Long Term Goal(s):     Short Term Goals:       Medication Management: Evaluate patient's response, side effects, and tolerance of medication regimen.  Therapeutic Interventions: 1 to 1 sessions, Unit Group sessions and Medication administration.  Evaluation of Outcomes: Not Met   RN Treatment Plan for Primary Diagnosis: Schizoaffective disorder (HCC) Long Term Goal(s): Knowledge of disease and therapeutic regimen to maintain health will improve  Short Term Goals: Ability to remain free from injury will improve, Ability to verbalize frustration and anger appropriately will improve, Ability to demonstrate self-control, Ability to participate in decision making will improve, Ability to verbalize feelings will improve, Ability to disclose and discuss suicidal ideas, Ability to identify and develop effective coping behaviors will improve, and Compliance with prescribed  medications will improve  Medication Management: RN will administer medications as ordered by provider, will assess and evaluate patient's response and provide education to patient for prescribed medication. RN will report any adverse and/or side effects to prescribing provider.  Therapeutic Interventions: 1 on 1 counseling sessions, Psychoeducation, Medication administration, Evaluate responses to treatment, Monitor vital signs and CBGs as ordered, Perform/monitor CIWA, COWS, AIMS and Fall Risk screenings as ordered, Perform wound care treatments as ordered.  Evaluation of Outcomes: Not Met   LCSW Treatment Plan for Primary Diagnosis: Schizoaffective disorder (HCC) Long Term Goal(s): Safe transition to appropriate next level of care at discharge, Engage patient in therapeutic group addressing interpersonal concerns.  Short Term Goals: Engage patient  in aftercare planning with referrals and resources, Increase social support, Increase ability to appropriately verbalize feelings, Increase emotional regulation, Facilitate acceptance of mental health diagnosis and concerns, Facilitate patient progression through stages of change regarding substance use diagnoses and concerns, Identify triggers associated with mental health/substance abuse issues, and Increase skills for wellness and recovery  Therapeutic Interventions: Assess for all discharge needs, 1 to 1 time with Social worker, Explore available resources and support systems, Assess for adequacy in community support network, Educate family and significant other(s) on suicide prevention, Complete Psychosocial Assessment, Interpersonal group therapy.  Evaluation of Outcomes: Not Met   Progress in Treatment: Attending groups: No. Participating in groups: No. Taking medication as prescribed: Yes. Toleration medication: Yes. Family/Significant other contact made: No, will contact:  if given permission.  Patient understands diagnosis:  No. Discussing patient identified problems/goals with staff: Yes. Medical problems stabilized or resolved: Yes. Denies suicidal/homicidal ideation: Yes. Issues/concerns per patient self-inventory: No. Other: none.  New problem(s) identified: No, Describe:  none identified.  New Short Term/Long Term Goal(s): detox, elimination of symptoms of psychosis, medication management for mood stabilization; elimination of SI thoughts; development of comprehensive mental wellness/sobriety plan.  Patient Goals:  "To get out of here and be good."   Discharge Plan or Barriers: CSW will assist pt with development of an appropriate aftercare/discharge plan.  Reason for Continuation of Hospitalization: Depression Medication stabilization Suicidal ideation Withdrawal symptoms  Estimated Length of Stay: 1-7 days  Last 3 [place] Suicide Severity Risk Score: Flowsheet Row Admission (Current) from 12[facility]24/2023 in [facility]PATIENT BE[facility]ICINE ED from 12/23/2023 in [facility] Emergency Department at [facility] ED from 07/30/2023 in [facility] Emergency Department at [facility]  C-SSRS RISK CATEGORY No Risk No Risk No Risk       Last PHQ 2/9 Scores:     No data to display          Scribe for Treatment Team: Randolm Butte, LCSW 12/24/2023 10:45 AM

## 2023-12-24 NOTE — Progress Notes (Signed)
   12/24/23 0741  Clinical Opiate Withdrawal Scale (COWS)  Resting Pulse Rate 0  Sweating 1  Restlessness 3  Pupil Size 0  Bone or Joint Aches 2  Runny Nose or Tearing 0  GI Upset 3  Tremor 2  Yawning 0  Anxiety or Irritability 2  Gooseflesh Skin 0  COWS Total Score 13   Notified MD requested COWS protocol orders
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
## 2023-12-24 NOTE — B[facility] Suicide Risk Assessment (Signed)
 [facility] INPATIENT:  Family/Significant Other Suicide Prevention Education  Suicide Prevention Education:  Patient Refusal for Family/Significant Other Suicide Prevention Education: The patient Gwendolyn Hampton has refused to provide written consent for family/significant other to be provided Family/Significant Other Suicide Prevention Education during admission and/or prior to discharge.  Physician notified.   SPE completed with pt, as pt refused to consent to family contact. SPI pamphlet provided to pt and pt was encouraged to share information with support network, ask questions, and talk about any concerns relating to SPE. Pt denies access to guns/firearms and verbalized understanding of information provided. Mobile Crisis information also provided to pt.    Fabio Wah M Azaliyah Kennard 12/24/2023, 11:00 AM

## 2023-12-24 NOTE — Tx Team (Signed)
 Initial Treatment Plan 12/24/2023 10:19 AM Claven Cumming ZOX:[id number]    PATIENT STRESSORS: Financial difficulties   Occupational concerns   Substance abuse     PATIENT STRENGTHS: Ability for insight    PATIENT IDENTIFIED PROBLEMS: Opioid Use Disorder                     DISCHARGE CRITERIA:  Withdrawal symptoms are absent or subacute and managed without 24-hour nursing intervention  PRELIMINARY DISCHARGE PLAN: Return to previous living arrangement  PATIENT/FAMILY INVOLVEMENT: This treatment plan has been presented to and reviewed with the patient, Sander Speckman,   The patient and family have been given the opportunity to ask questions and make suggestions.  Concepcion Deck, RN 12/24/2023, 10:19 AM
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
## 2023-12-24 NOTE — Progress Notes (Signed)
  [facility] Adult Case Management Discharge Plan :  Will you be returning to the same living situation after discharge:  Yes,  pt plans to return home. At discharge, do you have transportation home?: Yes,  pt to transport by support system. Do you have the ability to pay for your medications: Yes,  AETNA / [employer] [facility]  Release of information consent forms completed and in the chart;  Patient's signature needed at discharge.  Patient to Follow up at:  Follow-up Information     [facility] Schedule an appointment as soon as possible for a visit.   Why: Call to schedule appointment as stated for medication management and therapy as needed. They are open Monday-Friday 8AM to 5PM. Contact information: [address] [place] [address] [phone]                 Next level of care provider has access to [facility] Link:no  Safety Planning and Suicide Prevention discussed: Yes,  SPE completed with pt.      Has patient been referred to the Quitline?: Patient refused referral for treatment  Patient has been referred for addiction treatment: Patient refused referral for treatment.  Randolm Butte, LCSW 12/24/2023, 3:10 PM

## 2023-12-24 NOTE — ED Provider Notes (Addendum)
 12:55 AM  Pt now wanting to leave.  Will sign out AMA.  Resources provided.  Per previous ED provider and psychiatric team, no indication for IVC at this time.   Burnis Halling, Clover Dao, DO 12/24/23 0056   1:10 AM  Pt now agreeable to stay for psychiatric placement.   Gianlucas Evenson, Clover Dao, DO 12/24/23 0111

## 2023-12-24 NOTE — ED Notes (Signed)
 Patient to BHU 4 from main ED.  When patient informed she must remove her sports bra she stated she wanted to be discharged.  Informed patient that if she is discharged she will have to leave hospital property tonight as visiting hours are over and her boyfriend who was admitted is not allowed visitors at this time.  Patient verbalized understanding.  Charge nurse notified.  Patient into BHU 4 to await discharge instructions
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
## 2023-12-24 NOTE — Group Note (Signed)
 Recreation Therapy Group Note   Group Topic:Healthy Support Systems  Group Date: 12/24/2023 Start Time: 1035 End Time: 1130 Facilitators: Yvonna Herder, CTRS Location: Craft Room  Group Description: Straw Bridge. In groups or individually, patients were given 10 plastic drinking straws and an equal length of masking tape. Using the materials provided, patients were instructed to build a free-standing bridge-like structure to suspend an everyday item (ex: deck of cards) off the floor or table surface. All materials were required to be used in Secondary school teacher. LRT facilitated post-activity discussion reviewing the importance of having strong and healthy support systems in our lives. LRT discussed how the people in our lives serve as the tape and the deck of cards we placed on top of our straw structure are the stressors we face in daily life. LRT and pts discussed what happens in our life when things get too heavy for us , and we don't have strong supports outside of the hospital. Pt shared 2 of their healthy supports in their life aloud in the group.   Goal Area(s) Addressed:  Patient will identify 2 healthy supports in their life. Patient will identify skills to successfully complete activity. Patient will identify correlation of this activity to life post-discharge.  Patient will build on frustration tolerance skills. Patient will increase team building and communication skills.    Affect/Mood: Appropriate   Participation Level: Active and Engaged   Participation Quality: Independent   Behavior: Appropriate, Calm, and Cooperative   Speech/Thought Process: Coherent   Insight: Good   Judgement: Good   Modes of Intervention: Education, Exploration, Group work, and Music   Patient Response to Interventions:  Attentive, Engaged, Interested , and Receptive   Education Outcome:  Acknowledges education   Clinical Observations/Individualized Feedback: Leilanee was active in their  participation of session activities and group discussion. Pt identified "my kids, dogs, and husband" as healthy supports. Pt interacted well with LRT and peers duration of session.    Plan: Continue to engage patient in RT group sessions 2-3x/week.   Deatrice Factor, LRT, CTRS 12/24/2023 11:52 AM

## 2023-12-24 NOTE — ED Notes (Signed)
 This NT and security entered BHU room 4 and requested that the pt remove her bra if she decided to stay. Pt was cooperative and removed the undergarment. This NT and security safely stored the pt belongings in the locker room.

## 2023-12-24 NOTE — BH Assessment (Signed)
 Patient oriented to unit. Skin assessment and vital signs complete.

## 2023-12-24 NOTE — ED Notes (Signed)
 This RN notified by primary RN Dawn that patient has changed her mind and decided to stay. Pt remains in BHU 4 at this time. EDP Ward made aware of patient willing to stay.
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
## 2023-12-24 NOTE — ED Notes (Signed)
Attempted to call report.  No answer on unit. 

## 2024-01-28 ENCOUNTER — Other Ambulatory Visit: Payer: Self-pay

## 2024-01-28 ENCOUNTER — Emergency Department

## 2024-01-28 ENCOUNTER — Emergency Department
Admission: EM | Admit: 2024-01-28 | Discharge: 2024-01-28 | Disposition: A | Discharge status: Home / Self Care | Attending: Emergency Medicine | Admitting: Emergency Medicine

## 2024-01-28 DIAGNOSIS — E876 Hypokalemia: Secondary | ICD-10-CM | POA: Diagnosis not present

## 2024-01-28 DIAGNOSIS — D72829 Elevated white blood cell count, unspecified: Secondary | ICD-10-CM | POA: Insufficient documentation

## 2024-01-28 DIAGNOSIS — R079 Chest pain, unspecified: Secondary | ICD-10-CM | POA: Diagnosis not present

## 2024-01-28 DIAGNOSIS — F1193 Opioid use, unspecified with withdrawal: Secondary | ICD-10-CM

## 2024-01-28 DIAGNOSIS — R1010 Upper abdominal pain, unspecified: Secondary | ICD-10-CM | POA: Diagnosis not present

## 2024-01-28 DIAGNOSIS — R112 Nausea with vomiting, unspecified: Secondary | ICD-10-CM | POA: Diagnosis present

## 2024-01-28 DIAGNOSIS — F1113 Opioid abuse with withdrawal: Secondary | ICD-10-CM | POA: Insufficient documentation

## 2024-01-28 LAB — CBC
HCT: 35.6 % — ABNORMAL LOW (ref 36.0–46.0)
Hemoglobin: 12.3 g/dL (ref 12.0–15.0)
MCH: 28.9 pg (ref 26.0–34.0)
MCHC: 34.6 g/dL (ref 30.0–36.0)
MCV: 83.6 fL (ref 80.0–100.0)
Platelets: 273 10*3/uL (ref 150–400)
RBC: 4.26 MIL/uL (ref 3.87–5.11)
RDW: 12.9 % (ref 11.5–15.5)
WBC: 12.1 10*3/uL — ABNORMAL HIGH (ref 4.0–10.5)
nRBC: 0.2 % (ref 0.0–0.2)

## 2024-01-28 LAB — BASIC METABOLIC PANEL WITH GFR
Anion gap: 8 (ref 5–15)
BUN: 7 mg/dL (ref 6–20)
CO2: 26 mmol/L (ref 22–32)
Calcium: 8.1 mg/dL — ABNORMAL LOW (ref 8.9–10.3)
Chloride: 108 mmol/L (ref 98–111)
Creatinine, Ser: 0.66 mg/dL (ref 0.44–1.00)
GFR, Estimated: >60 mL/min (ref >60)
Glucose, Bld: 102 mg/dL — ABNORMAL HIGH (ref 70–99)
Potassium: 3.3 mmol/L — ABNORMAL LOW (ref 3.5–5.1)
Sodium: 142 mmol/L (ref 135–145)

## 2024-01-28 LAB — HEPATIC FUNCTION PANEL
ALT: 32 U/L (ref 0–44)
AST: 50 U/L — ABNORMAL HIGH (ref 15–41)
Albumin: 3.8 g/dL (ref 3.5–5.0)
Alkaline Phosphatase: 107 U/L (ref 38–126)
Bilirubin, Direct: 0.2 mg/dL (ref 0.0–0.2)
Indirect Bilirubin: 0.5 mg/dL (ref 0.3–0.9)
Total Bilirubin: 0.7 mg/dL (ref 0.0–1.2)
Total Protein: 7.4 g/dL (ref 6.5–8.1)

## 2024-01-28 LAB — MAGNESIUM: Magnesium: 1.7 mg/dL (ref 1.7–2.4)

## 2024-01-28 LAB — TROPONIN I (HIGH SENSITIVITY): Troponin I (High Sensitivity): 5 ng/L (ref <18)

## 2024-01-28 LAB — LIPASE, BLOOD: Lipase: 25 U/L (ref 11–51)

## 2024-01-28 MED ORDER — SODIUM CHLORIDE 0.9 % IV BOLUS
500.0000 mL | Freq: Once | INTRAVENOUS | Status: AC
  Administered 2024-01-28: 500 mL via INTRAVENOUS

## 2024-01-28 MED ORDER — LIDOCAINE VISCOUS HCL 2 % MT SOLN
15.0000 mL | Freq: Once | OROMUCOSAL | Status: AC
  Administered 2024-01-28: 15 mL via ORAL
  Filled 2024-01-28: qty 15

## 2024-01-28 MED ORDER — FAMOTIDINE IN NACL 20-0.9 MG/50ML-% IV SOLN
20.0000 mg | Freq: Once | INTRAVENOUS | Status: AC
  Administered 2024-01-28: 20 mg via INTRAVENOUS
  Filled 2024-01-28: qty 50

## 2024-01-28 MED ORDER — ACETAMINOPHEN 500 MG PO TABS
1000.0000 mg | ORAL_TABLET | Freq: Once | ORAL | Status: AC
  Administered 2024-01-28: 1000 mg via ORAL
  Filled 2024-01-28: qty 2

## 2024-01-28 MED ORDER — ONDANSETRON 4 MG PO TBDP: 4.0000 mg | ORAL_TABLET | Freq: Three times a day (TID) | ORAL | 0 refills | Status: AC | PRN

## 2024-01-28 MED ORDER — OMEPRAZOLE MAGNESIUM 20 MG PO TBEC: 20.0000 mg | DELAYED_RELEASE_TABLET | Freq: Every day | ORAL | 0 refills | Status: AC

## 2024-01-28 MED ORDER — DROPERIDOL 2.5 MG/ML IJ SOLN
2.5000 mg | Freq: Once | INTRAMUSCULAR | Status: AC
  Administered 2024-01-28: 2.5 mg via INTRAVENOUS
  Filled 2024-01-28: qty 2

## 2024-01-28 MED ORDER — ALUM & MAG HYDROXIDE-SIMETH 200-200-20 MG/5ML PO SUSP
30.0000 mL | Freq: Once | ORAL | Status: AC
  Administered 2024-01-28: 30 mL via ORAL
  Filled 2024-01-28: qty 30

## 2024-01-28 MED ORDER — POTASSIUM CHLORIDE CRYS ER 20 MEQ PO TBCR
20.0000 meq | EXTENDED_RELEASE_TABLET | Freq: Once | ORAL | Status: AC
  Administered 2024-01-28: 20 meq via ORAL
  Filled 2024-01-28: qty 1

## 2024-01-28 NOTE — ED Notes (Signed)
 See triage notes. Patient c/o left sided chest pain and vomiting that started last night. Patient reports vomiting blood and mild shortness of breath. Denies abdominal pain, cough, fever, and any significant cardiac history. Patient appears very anxious in the room.

## 2024-01-28 NOTE — Discharge Instructions (Signed)
 You are seen in the emergency department for chest pain with nausea and vomiting.  Concerned that you are withdrawing from opioids.  You were offered buprenorphine but you stated that this has been withdraw and you have some at home.  You had lab work done that showed normal heart enzymes.  Your chest x-ray was normal.  Follow-up closely with your primary care physician.  Follow-up as an outpatient with rehab.  Return to the emergency department if your symptoms worsen or return.  Your potassium was mildly low when checked today.  Make sure to follow up with a primary doctor to follow up your labs.  Make sure to eat food high in potassium and magnesium  - examples - potatoes, spinach, bananas, beans, avocadoes, oranges, nuts.  zofran  (ondansetron ) - nausea medication, take 1 tablet every 8 hours as needed for nausea/vomiting.

## 2024-01-28 NOTE — ED Triage Notes (Signed)
 Pt via POV from home. Pt c/o L sided CP that started today and reports emesis that started last night. Report that there is blood in her vomit. Mild SOB. Denies abd pain. Denies cough or fever. Denies any significant cardiac hx. Pt is A&Ox4 and NAD

## 2024-01-28 NOTE — ED Provider Notes (Signed)
 [facility] Provider Note    Event Date/Time   First MD Initiated Contact with Patient 01/28/24 1118     (approximate)   History   Chest Pain and Emesis   HPI  Gwendolyn Hampton is a 38 y.o. female past medical history significant for bipolar disorder, anxiety, ADHD, opioid use disorder, presents to the emergency department with nausea, vomiting and chest pain.  States that she has had multiple episodes of vomiting since last night.  Now having pain in her chest.  States that she was vomiting some blood.  States that she is currently withdrawing from opioids.  Has buprenorphine at home but states that it makes her feel worse so she has not taken it.  Denies any alcohol use or marijuana use.  Denies any Goody powder or NSAID use.  Prior hysterectomy and denies any concern for pregnancy.  Complaining of upper abdominal pain and chest pain.  Denies any diarrhea or blood in her stool.  On chart review patient had a recent evaluation for opioid withdrawal.     Physical Exam   Triage Vital Signs: ED Triage Vitals  Encounter Vitals Group     BP 01/28/24 1104 (!) 150/94     Systolic BP Percentile --      Diastolic BP Percentile --      Pulse Rate 01/28/24 1104 85     Resp 01/28/24 1104 18     Temp 01/28/24 1104 99.3 F (37.4 C)     Temp Source 01/28/24 1104 Oral     SpO2 01/28/24 1104 99 %     Weight 01/28/24 1103 130 lb (59 kg)     Height 01/28/24 1103 5\' 1"  (1.549 m)     Head Circumference --      Peak Flow --      Pain Score 01/28/24 1103 10     Pain Loc --      Pain Education --      Exclude from Growth Chart --     Most recent vital signs: Vitals:   01/28/24 1104  BP: (!) 150/94  Pulse: 85  Resp: 18  Temp: 99.3 F (37.4 C)  SpO2: 99%    Physical Exam Constitutional:      Appearance: She is well-developed.     Comments: Actively dry heaving and spitting into a emesis bag.  No blood or coffee ground emesis.  HENT:     Head: Atraumatic.   Eyes:     Conjunctiva/sclera: Conjunctivae normal.  Cardiovascular:     Rate and Rhythm: Regular rhythm.  Pulmonary:     Effort: No respiratory distress.  Abdominal:     General: There is no distension.     Palpations: Abdomen is soft.     Tenderness: There is no abdominal tenderness. There is no guarding or rebound.  Musculoskeletal:        General: Normal range of motion.     Cervical back: Normal range of motion.  Skin:    General: Skin is warm.     Capillary Refill: Capillary refill takes less than 2 seconds.  Neurological:     Mental Status: She is alert. Mental status is at baseline.     IMPRESSION / MDM / ASSESSMENT AND PLAN / ED COURSE  I reviewed the triage vital signs and the nursing notes.  Differential diagnosis including peptic ulcer disease/gastritis, Mallory-Weiss tear, ACS, opioid withdrawal, electrolyte abnormality, dehydration, pancreatitis   EKG  I, Viviano Ground, the attending physician, personally  viewed and interpreted this ECG.   Rate: Normal  Rhythm: Normal sinus  Axis: Normal  Intervals: Normal  ST&T Change: None.  Normal QTc. No findings of acute pericarditis  No tachycardic or bradycardic dysrhythmias while on cardiac telemetry.  RADIOLOGY I independently reviewed imaging, my interpretation of imaging: Chest x-ray with no signs of pneumonia, no widened mediastinum.  No air under the diaphragm.  Read as no acute findings.  LABS (all labs ordered are listed, but only abnormal results are displayed) Labs interpreted as -    Labs Reviewed  BASIC METABOLIC PANEL WITH GFR - Abnormal; Notable for the following components:      Result Value   Potassium 3.3 (*)    Glucose, Bld 102 (*)    Calcium 8.1 (*)    All other components within normal limits  CBC - Abnormal; Notable for the following components:   WBC 12.1 (*)    HCT 35.6 (*)    All other components within normal limits  HEPATIC FUNCTION PANEL - Abnormal; Notable for the following  components:   AST 50 (*)    All other components within normal limits  MAGNESIUM   LIPASE, BLOOD  TROPONIN I (HIGH SENSITIVITY)     MDM  Actively dry heaving in the room.  Mild leukocytosis.  Abdomen with no focal findings or abdominal tenderness to palpation.  Patient treated with antiemetics, IV fluids and IV Pepcid.  Offered and discussed buprenorphine given her concern for opioid withdrawal however patient declined and does not want any buprenorphine at this time.  Clinical Course as of 01/28/24 1316  Mon Jan 28, 2024  1230 On reevaluation continues to complain of some chest pain.  No longer with nausea or vomiting.  Troponin is negative with low suspicion for ACS.  Will give a GI cocktail and Tylenol  for pain control.  No hematemesis noted in the emergency department no coffee-ground emesis, have a low suspicion for Boerhaave syndrome. [SM]    Clinical Course User Index [SM] Viviano Ground, MD   Clinical factors not consistent with acute pericarditis.  And also troponin is negative.  Low suspicion for ACS and has a low risk heart score.  Low suspicion for pulmonary embolism.  Reevaluation no focal abdominal tenderness to palpation and no rebound or guarding.  Tolerated p.o. with potassium tablet.  Clinical picture is most likely secondary to opioid withdrawal.  Has buprenorphine at home.  Encouraged to follow-up as an outpatient with her primary care physician and with rehab.  Given a prescription for antiemetics.  Will start on a PPI for possible gastritis.  Discussed return to the emergency department for any worsening symptoms or return of symptoms.  PROCEDURES:  Critical Care performed: No  Procedures  Patient's presentation is most consistent with acute presentation with potential threat to life or bodily function.   MEDICATIONS ORDERED IN ED: Medications  sodium chloride  0.9 % bolus 500 mL (0 mLs Intravenous Stopped 01/28/24 1248)  famotidine (PEPCID) IVPB 20 mg  premix (0 mg Intravenous Stopped 01/28/24 1248)  droperidol (INAPSINE) 2.5 MG/ML injection 2.5 mg (2.5 mg Intravenous Given 01/28/24 1206)  potassium chloride SA (KLOR-CON M) CR tablet 20 mEq (20 mEq Oral Given 01/28/24 1246)  acetaminophen  (TYLENOL ) tablet 1,000 mg (1,000 mg Oral Given 01/28/24 1246)  alum & mag hydroxide-simeth (MAALOX/MYLANTA) 200-200-20 MG/5ML suspension 30 mL (30 mLs Oral Given 01/28/24 1246)    And  lidocaine  (XYLOCAINE ) 2 % viscous mouth solution 15 mL (15 mLs Oral Given 01/28/24 1246)  FINAL CLINICAL IMPRESSION(S) / ED DIAGNOSES   Final diagnoses:  Nausea and vomiting, unspecified vomiting type  Opioid withdrawal (HCC)  Hypokalemia     Rx / DC Orders   ED Discharge Orders          Ordered    ondansetron  (ZOFRAN -ODT) 4 MG disintegrating tablet  Every 8 hours PRN        01/28/24 1230    omeprazole (PRILOSEC OTC) 20 MG tablet  Daily        01/28/24 1315             Note:  This document was prepared using Dragon voice recognition software and may include unintentional dictation errors.   Viviano Ground, MD 01/28/24 1316
